# Patient Record
Sex: Male | Born: 1955
Health system: Southern US, Community
[De-identification: ages and names within clinical notes are randomized; demographics above are authoritative.]

## PROBLEM LIST (undated history)

## (undated) DIAGNOSIS — N200 Calculus of kidney: Secondary | ICD-10-CM

## (undated) DIAGNOSIS — M6530 Trigger finger, unspecified finger: Secondary | ICD-10-CM

## (undated) DIAGNOSIS — R002 Palpitations: Secondary | ICD-10-CM

## (undated) DIAGNOSIS — E785 Hyperlipidemia, unspecified: Secondary | ICD-10-CM

## (undated) DIAGNOSIS — N529 Male erectile dysfunction, unspecified: Secondary | ICD-10-CM

## (undated) DIAGNOSIS — R112 Nausea with vomiting, unspecified: Secondary | ICD-10-CM

## (undated) DIAGNOSIS — Z9889 Other specified postprocedural states: Secondary | ICD-10-CM

## (undated) DIAGNOSIS — R945 Abnormal results of liver function studies: Secondary | ICD-10-CM

## (undated) HISTORY — DX: Calculus of kidney: N20.0

## (undated) HISTORY — DX: Hyperlipidemia, unspecified: E78.5

## (undated) HISTORY — DX: Trigger finger, unspecified finger: M65.30

## (undated) HISTORY — DX: Abnormal results of liver function studies: R94.5

## (undated) HISTORY — DX: Male erectile dysfunction, unspecified: N52.9

## (undated) HISTORY — DX: Palpitations: R00.2

---

## 1989-08-22 HISTORY — PX: LAMINECTOMY: SHX219

## 1998-07-31 ENCOUNTER — Encounter: Payer: Self-pay | Admitting: Family Medicine

## 1998-07-31 LAB — CONVERTED CEMR LAB: PSA: 0.8 ng/mL

## 2004-06-24 ENCOUNTER — Ambulatory Visit: Payer: Self-pay | Admitting: Family Medicine

## 2004-06-29 ENCOUNTER — Ambulatory Visit (HOSPITAL_COMMUNITY): Admission: RE | Admit: 2004-06-29 | Discharge: 2004-06-29 | Payer: Self-pay | Admitting: Family Medicine

## 2004-07-20 ENCOUNTER — Ambulatory Visit (HOSPITAL_COMMUNITY): Admission: RE | Admit: 2004-07-20 | Discharge: 2004-07-21 | Payer: Self-pay | Admitting: Neurosurgery

## 2004-08-06 ENCOUNTER — Ambulatory Visit: Payer: Self-pay | Admitting: Family Medicine

## 2004-08-20 ENCOUNTER — Ambulatory Visit: Payer: Self-pay | Admitting: Family Medicine

## 2004-08-22 HISTORY — PX: LAMINECTOMY: SHX219

## 2004-08-24 ENCOUNTER — Ambulatory Visit: Payer: Self-pay | Admitting: Family Medicine

## 2006-09-01 ENCOUNTER — Ambulatory Visit: Payer: Self-pay | Admitting: Family Medicine

## 2006-12-21 HISTORY — PX: CT ABD W & PELVIS WO CM: HXRAD294

## 2006-12-31 ENCOUNTER — Emergency Department: Payer: Self-pay | Admitting: Unknown Physician Specialty

## 2007-01-01 ENCOUNTER — Telehealth: Payer: Self-pay | Admitting: Family Medicine

## 2007-01-05 ENCOUNTER — Telehealth (INDEPENDENT_AMBULATORY_CARE_PROVIDER_SITE_OTHER): Payer: Self-pay | Admitting: *Deleted

## 2007-01-13 DIAGNOSIS — R002 Palpitations: Secondary | ICD-10-CM | POA: Insufficient documentation

## 2007-02-20 ENCOUNTER — Emergency Department (HOSPITAL_COMMUNITY): Admission: EM | Admit: 2007-02-20 | Discharge: 2007-02-20 | Payer: Self-pay | Admitting: Emergency Medicine

## 2007-02-21 ENCOUNTER — Encounter: Payer: Self-pay | Admitting: Family Medicine

## 2007-02-21 DIAGNOSIS — N200 Calculus of kidney: Secondary | ICD-10-CM | POA: Insufficient documentation

## 2007-02-21 DIAGNOSIS — E785 Hyperlipidemia, unspecified: Secondary | ICD-10-CM | POA: Insufficient documentation

## 2007-03-02 ENCOUNTER — Ambulatory Visit: Payer: Self-pay | Admitting: Cardiology

## 2007-03-07 ENCOUNTER — Encounter: Payer: Self-pay | Admitting: Cardiology

## 2007-03-07 ENCOUNTER — Ambulatory Visit: Payer: Self-pay

## 2007-08-21 ENCOUNTER — Ambulatory Visit: Payer: Self-pay | Admitting: Family Medicine

## 2007-08-21 DIAGNOSIS — I493 Ventricular premature depolarization: Secondary | ICD-10-CM | POA: Insufficient documentation

## 2007-08-21 LAB — CONVERTED CEMR LAB
Cholesterol: 207 mg/dL (ref 0–200)
Direct LDL: 118.7 mg/dL
HDL: 36.6 mg/dL — ABNORMAL LOW (ref >39.0)
Magnesium: 2.3 mg/dL (ref 1.5–2.5)
TSH: 4.72 microintl units/mL (ref 0.35–5.50)
Total CHOL/HDL Ratio: 5.7
Triglycerides: 149 mg/dL (ref 0–149)
VLDL: 30 mg/dL (ref 0–40)

## 2007-08-22 ENCOUNTER — Ambulatory Visit: Payer: Self-pay | Admitting: Family Medicine

## 2007-10-01 ENCOUNTER — Ambulatory Visit: Payer: Self-pay | Admitting: Family Medicine

## 2007-10-01 LAB — CONVERTED CEMR LAB
ALT: 46 units/L (ref 0–53)
AST: 31 units/L (ref 0–37)

## 2007-10-19 ENCOUNTER — Ambulatory Visit: Payer: Self-pay | Admitting: Family Medicine

## 2007-10-20 LAB — CONVERTED CEMR LAB
ALT: 63 units/L — ABNORMAL HIGH (ref 0–53)
AST: 70 units/L — ABNORMAL HIGH (ref 0–37)
Albumin: 3.7 g/dL (ref 3.5–5.2)
Alkaline Phosphatase: 71 units/L (ref 39–117)
BUN: 12 mg/dL (ref 6–23)
Basophils Absolute: 0 10*3/uL (ref 0.0–0.1)
Basophils Relative: 0.3 % (ref 0.0–1.0)
Bilirubin, Direct: 0.1 mg/dL (ref 0.0–0.3)
CO2: 30 meq/L (ref 19–32)
Calcium: 9.3 mg/dL (ref 8.4–10.5)
Chloride: 106 meq/L (ref 96–112)
Cholesterol: 142 mg/dL (ref 0–200)
Creatinine, Ser: 1.1 mg/dL (ref 0.4–1.5)
Eosinophils Absolute: 0.1 10*3/uL (ref 0.0–0.6)
Eosinophils Relative: 3.3 % (ref 0.0–5.0)
GFR calc Af Amer: 90 mL/min
GFR calc non Af Amer: 75 mL/min
Glucose, Bld: 97 mg/dL (ref 70–99)
HCT: 44.6 % (ref 39.0–52.0)
HDL: 40.6 mg/dL (ref >39.0)
Hemoglobin: 15.2 g/dL (ref 13.0–17.0)
LDL Cholesterol: 88 mg/dL (ref 0–99)
Lymphocytes Relative: 30.2 % (ref 12.0–46.0)
MCHC: 34.2 g/dL (ref 30.0–36.0)
MCV: 98.6 fL (ref 78.0–100.0)
Monocytes Absolute: 0.6 10*3/uL (ref 0.2–0.7)
Monocytes Relative: 13.3 % — ABNORMAL HIGH (ref 3.0–11.0)
Neutro Abs: 2.4 10*3/uL (ref 1.4–7.7)
Neutrophils Relative %: 52.9 % (ref 43.0–77.0)
PSA: 0.43 ng/mL (ref 0.10–4.00)
Platelets: 136 10*3/uL — ABNORMAL LOW (ref 150–400)
Potassium: 4.8 meq/L (ref 3.5–5.1)
RBC: 4.52 M/uL (ref 4.22–5.81)
RDW: 12.5 % (ref 11.5–14.6)
Sodium: 140 meq/L (ref 135–145)
TSH: 4.38 microintl units/mL (ref 0.35–5.50)
Total Bilirubin: 0.9 mg/dL (ref 0.3–1.2)
Total CHOL/HDL Ratio: 3.5
Total Protein: 6.3 g/dL (ref 6.0–8.3)
Triglycerides: 68 mg/dL (ref 0–149)
VLDL: 14 mg/dL (ref 0–40)
WBC: 4.4 10*3/uL — ABNORMAL LOW (ref 4.5–10.5)

## 2007-10-24 ENCOUNTER — Ambulatory Visit: Payer: Self-pay | Admitting: Family Medicine

## 2007-10-24 DIAGNOSIS — R945 Abnormal results of liver function studies: Secondary | ICD-10-CM | POA: Insufficient documentation

## 2007-11-20 ENCOUNTER — Ambulatory Visit: Payer: Self-pay | Admitting: Family Medicine

## 2007-11-20 LAB — CONVERTED CEMR LAB
OCCULT 1: NEGATIVE
OCCULT 2: NEGATIVE
OCCULT 3: NEGATIVE

## 2007-11-20 LAB — FECAL OCCULT BLOOD, GUAIAC: Fecal Occult Blood: NEGATIVE

## 2007-11-21 ENCOUNTER — Encounter (INDEPENDENT_AMBULATORY_CARE_PROVIDER_SITE_OTHER): Payer: Self-pay | Admitting: *Deleted

## 2007-12-17 ENCOUNTER — Ambulatory Visit: Payer: Self-pay | Admitting: Family Medicine

## 2007-12-17 DIAGNOSIS — S61409A Unspecified open wound of unspecified hand, initial encounter: Secondary | ICD-10-CM | POA: Insufficient documentation

## 2008-01-01 ENCOUNTER — Ambulatory Visit: Payer: Self-pay | Admitting: Family Medicine

## 2008-01-03 LAB — CONVERTED CEMR LAB
ALT: 46 units/L (ref 0–53)
AST: 34 units/L (ref 0–37)

## 2008-04-08 ENCOUNTER — Ambulatory Visit: Payer: Self-pay | Admitting: Family Medicine

## 2008-04-08 DIAGNOSIS — L918 Other hypertrophic disorders of the skin: Secondary | ICD-10-CM | POA: Insufficient documentation

## 2009-07-21 ENCOUNTER — Ambulatory Visit: Payer: Self-pay | Admitting: Cardiovascular Disease

## 2009-08-04 ENCOUNTER — Ambulatory Visit: Payer: Self-pay | Admitting: Cardiovascular Disease

## 2009-08-10 LAB — CONVERTED CEMR LAB
BUN: 18 mg/dL (ref 6–23)
CO2: 23 meq/L (ref 19–32)
Calcium: 9.2 mg/dL (ref 8.4–10.5)
Chloride: 106 meq/L (ref 96–112)
Creatinine, Ser: 1 mg/dL (ref 0.40–1.50)
Glucose, Bld: 121 mg/dL — ABNORMAL HIGH (ref 70–99)
Potassium: 4 meq/L (ref 3.5–5.3)
Sodium: 140 meq/L (ref 135–145)

## 2010-03-15 ENCOUNTER — Ambulatory Visit: Payer: Self-pay | Admitting: Family Medicine

## 2010-03-15 DIAGNOSIS — N529 Male erectile dysfunction, unspecified: Secondary | ICD-10-CM | POA: Insufficient documentation

## 2010-03-18 ENCOUNTER — Encounter (INDEPENDENT_AMBULATORY_CARE_PROVIDER_SITE_OTHER): Payer: Self-pay | Admitting: *Deleted

## 2010-03-19 ENCOUNTER — Ambulatory Visit: Payer: Self-pay | Admitting: Family Medicine

## 2010-03-22 LAB — CONVERTED CEMR LAB
ALT: 29 units/L (ref 0–53)
AST: 26 units/L (ref 0–37)
Albumin: 3.6 g/dL (ref 3.5–5.2)
Alkaline Phosphatase: 58 units/L (ref 39–117)
BUN: 19 mg/dL (ref 6–23)
Bilirubin, Direct: 0.1 mg/dL (ref 0.0–0.3)
CO2: 27 meq/L (ref 19–32)
Calcium: 8.9 mg/dL (ref 8.4–10.5)
Chloride: 106 meq/L (ref 96–112)
Cholesterol: 138 mg/dL (ref 0–200)
Creatinine, Ser: 1.1 mg/dL (ref 0.4–1.5)
GFR calc non Af Amer: 73.84 mL/min (ref >60)
Glucose, Bld: 102 mg/dL — ABNORMAL HIGH (ref 70–99)
HDL: 36.4 mg/dL — ABNORMAL LOW (ref >39.00)
LDL Cholesterol: 81 mg/dL (ref 0–99)
Potassium: 4.3 meq/L (ref 3.5–5.1)
Sodium: 138 meq/L (ref 135–145)
Total Bilirubin: 0.8 mg/dL (ref 0.3–1.2)
Total CHOL/HDL Ratio: 4
Total Protein: 6 g/dL (ref 6.0–8.3)
Triglycerides: 102 mg/dL (ref 0.0–149.0)
VLDL: 20.4 mg/dL (ref 0.0–40.0)

## 2010-03-30 ENCOUNTER — Encounter (INDEPENDENT_AMBULATORY_CARE_PROVIDER_SITE_OTHER): Payer: Self-pay | Admitting: *Deleted

## 2010-07-14 ENCOUNTER — Telehealth (INDEPENDENT_AMBULATORY_CARE_PROVIDER_SITE_OTHER): Payer: Self-pay | Admitting: *Deleted

## 2010-07-22 ENCOUNTER — Ambulatory Visit: Payer: Self-pay | Admitting: Family Medicine

## 2010-07-23 LAB — CONVERTED CEMR LAB
ALT: 33 units/L (ref 0–53)
AST: 27 units/L (ref 0–37)
Albumin: 3.9 g/dL (ref 3.5–5.2)
Alkaline Phosphatase: 67 units/L (ref 39–117)
BUN: 18 mg/dL (ref 6–23)
Bilirubin, Direct: 0.1 mg/dL (ref 0.0–0.3)
CO2: 28 meq/L (ref 19–32)
Calcium: 9 mg/dL (ref 8.4–10.5)
Chloride: 101 meq/L (ref 96–112)
Cholesterol: 183 mg/dL (ref 0–200)
Creatinine, Ser: 1.2 mg/dL (ref 0.4–1.5)
GFR calc non Af Amer: 68.01 mL/min (ref >60)
Glucose, Bld: 101 mg/dL — ABNORMAL HIGH (ref 70–99)
HDL: 44.1 mg/dL (ref >39.00)
LDL Cholesterol: 119 mg/dL — ABNORMAL HIGH (ref 0–99)
PSA: 0.54 ng/mL (ref 0.10–4.00)
Potassium: 4.1 meq/L (ref 3.5–5.1)
Sodium: 137 meq/L (ref 135–145)
Total Bilirubin: 0.8 mg/dL (ref 0.3–1.2)
Total CHOL/HDL Ratio: 4
Total Protein: 6.5 g/dL (ref 6.0–8.3)
Triglycerides: 101 mg/dL (ref 0.0–149.0)
VLDL: 20.2 mg/dL (ref 0.0–40.0)

## 2010-07-26 ENCOUNTER — Ambulatory Visit: Payer: Self-pay | Admitting: Family Medicine

## 2010-07-26 ENCOUNTER — Encounter (INDEPENDENT_AMBULATORY_CARE_PROVIDER_SITE_OTHER): Payer: Self-pay | Admitting: *Deleted

## 2010-07-26 DIAGNOSIS — M6530 Trigger finger, unspecified finger: Secondary | ICD-10-CM | POA: Insufficient documentation

## 2010-07-26 LAB — CONVERTED CEMR LAB
Bilirubin Urine: NEGATIVE
Blood in Urine, dipstick: NEGATIVE
Glucose, Urine, Semiquant: NEGATIVE
Ketones, urine, test strip: NEGATIVE
Nitrite: NEGATIVE
Protein, U semiquant: NEGATIVE
Specific Gravity, Urine: 1.015
Urobilinogen, UA: 0.2
WBC Urine, dipstick: NEGATIVE
pH: 6

## 2010-07-29 ENCOUNTER — Encounter: Payer: Self-pay | Admitting: Family Medicine

## 2010-08-06 ENCOUNTER — Encounter: Payer: Self-pay | Admitting: Family Medicine

## 2010-09-06 ENCOUNTER — Ambulatory Visit: Admit: 2010-09-06 | Payer: Self-pay | Admitting: Gastroenterology

## 2010-09-19 LAB — CONVERTED CEMR LAB
BUN: 24 mg/dL — ABNORMAL HIGH (ref 6–23)
CO2: 24 meq/L (ref 19–32)
Calcium: 8.9 mg/dL (ref 8.4–10.5)
Chloride: 104 meq/L (ref 96–112)
Creatinine, Ser: 1.57 mg/dL — ABNORMAL HIGH (ref 0.40–1.50)
Glucose, Bld: 110 mg/dL — ABNORMAL HIGH (ref 70–99)
Potassium: 4.2 meq/L (ref 3.5–5.3)
Sodium: 140 meq/L (ref 135–145)
TSH: 3.835 microintl units/mL (ref 0.350–4.500)

## 2010-09-22 NOTE — Assessment & Plan Note (Signed)
Summary: REFILL MED/XFER FROM DR SCHALLER/CLE   Vital Signs:  Patient profile:   55 year old male Height:      69 inches Weight:      174.75 pounds BMI:     25.90 Temp:     97.8 degrees F oral Pulse rate:   80 / minute Pulse rhythm:   regular BP sitting:   142 / 90  (left arm) Cuff size:   regular  Vitals Entered By: Delilah Shan CMA Kerolos Nehme Dull) (March 15, 2010 2:53 PM) CC: Refill medication   History of Present Illness: Elevated Cholesterol: Using medications without problems:yes Muscle aches: not now, but initially had some knee aches- it is resolved Other complaints: no  ED- Occ noted lack of function.  No CP/SOB.  Good exertional capacity.  Asking about meds.  Never treated previously.   Allergies: No Known Drug Allergies  Past History:  Past Medical History: Last updated: 07/21/2009 Hyperlipidemia Palpitations secondary to PVCs Nephrolithiasis  Family History: Last updated: 03/15/2010 FatheR: ALIVE, healthy Mother: DECEASED age 46  DM, HTN No heart disease in siblings CV: NEGATIVE BP: + HTN MOTHER DIABETES: + MOTHER CANCER: NEGATIVE  Social History: Last updated: 03/15/2010 Marital Status: Married LIVES WITH WIFE  Children: ONE STEP SON Occupation: [employer] No tobacco No illicit drugs No alcohol Likes to travel with RV  Family History: Reviewed history from 07/21/2009 and no changes required. FatheR: ALIVE, healthy Mother: DECEASED age 25  DM, HTN No heart disease in siblings CV: NEGATIVE BP: + HTN MOTHER DIABETES: + MOTHER CANCER: NEGATIVE  Social History: Reviewed history from 07/21/2009 and no changes required. Marital Status: Married LIVES WITH WIFE  Children: ONE STEP SON Occupation: [employer] No tobacco No illicit drugs No alcohol Likes to travel with RV  Review of Systems       See HPI.  Otherwise negative.    Physical Exam  General:  GEN: nad, alert and oriented HEENT: mucous membranes moist NECK: supple w/o LA CV:  rrr.  no murmur PULM: ctab, no inc wob ABD: soft, +bs EXT: no edema SKIN: no acute rash    Impression & Recommendations:  Problem # 1:  ORGANIC IMPOTENCE (ICD-607.84) Routine instructions and follow up as needed.  he agrees.  His updated medication list for this problem includes:    Viagra 100 Mg Tabs (Sildenafil citrate) .Marland Kitchen... 0.5-1 tab by mouth qday as needed.  Problem # 2:  HYPERLIPIDEMIA (ICD-272.4) No change in meds.  Return for labs.  His updated medication list for this problem includes:    Pravachol 40 Mg Tabs (Pravastatin sodium) ..... One tab by mouth at night  Complete Medication List: 1)  Glucosamine 1500 Complex Caps (Glucosamine-chondroit-vit c-mn) .Marland Kitchen.. 1 daily 2)  Pravachol 40 Mg Tabs (Pravastatin sodium) .... One tab by mouth at night 3)  Fish Oil 1000 Mg Caps (Omega-3 fatty acids) .... Take 1 capsule by mouth three times a day 4)  Viagra 100 Mg Tabs (Sildenafil citrate) .... 0.5-1 tab by mouth qday as needed.  Patient Instructions: 1)  fasting cmet/lipid 272.0 2)  Schedule a lab visit on the way out today.  3)  We'll contact you with your lab report.   4)  Please schedule a follow-up appointment in 6 months for CPE.  Prescriptions: PRAVACHOL 40 MG  TABS (PRAVASTATIN SODIUM) one tab by mouth at night  #90 x 3   Entered and Authorized by:   Crawford Givens MD   Signed by:   Crawford Givens MD on  03/15/2010   Method used:   Electronically to        [facility] [address] (retail)       [address]       [place], [place]  [address]       Ph: [phone]       Fax: [phone]   RxID:   [id number] VIAGRA 100 MG TABS (SILDENAFIL CITRATE) 0.5-1 tab by mouth qday as needed.  #10 x 5   Entered and Authorized by:   Crawford Givens MD   Signed by:   Crawford Givens MD on 03/15/2010   Method used:   Electronically to        [facility]* (retail)       [address]       [place], [place]  [address]       Ph: [phone]       Fax: [phone]    RxID:   [id number]   Current Allergies (reviewed today): No known allergies

## 2010-09-22 NOTE — Letter (Signed)
Summary: Nadara Eaton letter  [place] at [facility]  [address] [place], [place] [address]   Phone: [phone]  Fax: [phone]       03/30/2010 MRN: [id number]  Zyshawn Pagnotta [address] [place], [place]  [address]  Dear Mr. [facility] [place], and [place] announce the retirement of Arta Silence, M.D., from full-time practice at the [facility] office effective February 18, 2010 and his plans of returning part-time.  It is important to Dr. Hetty Ely and to our practice that you understand that [facility] has seven physicians in our office for your health care needs.  We will continue to offer the same exceptional care that you have today.    Dr. Hetty Ely has spoken to many of you about his plans for retirement and returning part-time in the fall.   We will continue to work with you through the transition to schedule appointments for you in the office and meet the high standards that [place] is committed to.   Again, it is with great pleasure that we share the news that Dr. Hetty Ely will return to [facility] in October of 2011 with a reduced schedule.    If you have any questions, or would like to request an appointment with one of our physicians, please call us at [phone] and press the option for Scheduling an appointment.  We take pleasure in providing you with excellent patient care and look forward to seeing you at your next office visit.  Our [facility] Physicians are:  Tillman Abide, M.D. Laurita Quint, M.D. Roxy Manns, M.D. Kerby Nora, M.D. Hannah Beat, M.D. Ruthe Mannan, M.D. We proudly welcomed Raechel Ache, M.D. and Eustaquio Boyden, M.D. to the practice in July/August 2011.  Sincerely,  [facility]

## 2010-09-22 NOTE — Letter (Signed)
Summary: Pre Visit Letter Revised  [facility]  [address] [place], [place] [address]   Phone: [phone]  Fax: [phone]        07/26/2010 MRN: [id number] [name] [name] [address] [place], [place]  [address]             Procedure Date:  09/21/2010   Welcome to the Gastroenterology Division at [facility].    You are scheduled to see a nurse for your pre-procedure visit on 09/06/2010 at 11:00 on the 3rd floor at [facility], [address]  We ask that you try to arrive at our office 15 minutes prior to your appointment time to allow for check-in.  Please take a minute to review the attached form.  If you answer "Yes" to one or more of the questions on the first page, we ask that you call the person listed at your earliest opportunity.  If you answer "No" to all of the questions, please complete the rest of the form and bring it to your appointment.    Your nurse visit will consist of discussing your medical and surgical history, your immediate family medical history, and your medications.   If you are unable to list all of your medications on the form, please bring the medication bottles to your appointment and we will list them.  We will need to be aware of both prescribed and over the counter drugs.  We will need to know exact dosage information as well.    Please be prepared to read and sign documents such as consent forms, a financial agreement, and acknowledgement forms.  If necessary, and with your consent, a friend or relative is welcome to sit-in on the nurse visit with you.  Please bring your insurance card so that we may make a copy of it.  If your insurance requires a referral to see a specialist, please bring your referral form from your primary care physician.  No co-pay is required for this nurse visit.     If you cannot keep your appointment, please call [phone] to cancel or reschedule prior to your appointment date.  This allows  [place] the opportunity to schedule an appointment for another patient in need of care.    Thank you for choosing [facility] for your medical needs.  We appreciate the opportunity to care for you.  Please visit [place] at our website  to learn more about our practice.  Sincerely, The Gastroenterology Division

## 2010-09-22 NOTE — Letter (Signed)
Summary: [place] No Show Letter  [place] [facility]  [address] [place], [place] [address]   Phone: [phone]  Fax: [phone]    03/18/2010 MRN: [id number]  [name] [name] [address] [place], [place]  [address]   Dear Mr. Slattery,   Our records indicate that you missed your scheduled appointment with ___LAB__________________ on _7.27.11___________.  Please contact this office to reschedule your appointment as soon as possible.  It is important that you keep your scheduled appointments with your physician, so we can provide you the best care possible.  Please be advised that there may be a charge for "no show" appointments.    Sincerely,   [place] [facility]

## 2010-09-22 NOTE — Assessment & Plan Note (Signed)
Summary: CPX / LFW   Vital Signs:  Patient profile:   55 year old male Height:      69 inches Weight:      177.75 pounds BMI:     26.34 Temp:     98.2 degrees F oral Pulse rate:   80 / minute Pulse rhythm:   regular BP sitting:   116 / 74  (left arm) Cuff size:   large  Vitals Entered By: Delilah Shan CMA Duncan Dull) (July 26, 2010 11:14 AM) CC: CPX  Vision Screening:Left eye w/o correction: 20 / 20 Right Eye w/o correction: 20 / 20 Both eyes w/o correction:  20/ 15        Vision Entered By: Delilah Shan CMA Duncan Dull) (July 26, 2010 12:18 PM)   History of Present Illness: CPE- See plan.  labs reviewed with patient.   ED- Frequent intercourse as wife's libido is increased with HRT.  Asking about daily dosing of cialis.  tolerated viagra with no CP, adverse effect.   Elevated Cholesterol: Using medications without problems:yes Muscle aches: no Other complaints: no  R middle finger.  He can flex it but then he can't extend the PIP w/o manipulation.  No injury.  Gradually getting worse.    Needs DOT phyical.  He'll get the form and drop it off.  he doesn't have it today.   Allergies: No Known Drug Allergies  Past History:  Past Medical History: Last updated: 07/21/2009 Hyperlipidemia Palpitations secondary to PVCs Nephrolithiasis  Past Surgical History: Last updated: 10/24/2007 laminectomy: L 5: S-1/ (08/1989) Laminectomy L5 S1 (08/2004) CT ABD / PELVIS : STONE AT  [facility], DIVERTICS :(12/21/2006)  Family History: Last updated: 03/15/2010 FatheR: ALIVE, healthy Mother: DECEASED age 88  DM, HTN No heart disease in siblings CV: NEGATIVE BP: + HTN MOTHER DIABETES: + MOTHER CANCER: NEGATIVE  Social History: Last updated: 03/15/2010 Marital Status: Married LIVES WITH WIFE  Children: ONE STEP SON Occupation: [employer] No tobacco No illicit drugs No alcohol Likes to travel with RV  Review of Systems       See HPI.  Otherwise negative.     Physical Exam  General:  GEN: nad, alert and oriented HEENT: mucous membranes moist, PERRL, EOMI, tm wnl, nasal and oral exam wnl NECK: supple w/o LA CV: rrr.  no murmur PULM: ctab, no inc wob ABD: soft, +bs EXT: no edema SKIN: no acute rash  Normal strength/sens x4-gross, speech, gait wnl R 3rd PIP with ability to flex but unable to extend Rectal:  No external abnormalities noted. Normal sphincter tone. No rectal masses or tenderness. Prostate:  Prostate gland firm and smooth, no enlargement, nodularity, tenderness, mass, asymmetry or induration.   Impression & Recommendations:  Problem # 1:  HEALTH MAINTENANCE EXAM (ICD-V70.0) Healthy habits encouraged.  d/w patient about glucose today and avoiding high glycemic foods.  cont exercise.  flu shot done today.   Hearing (whispered across the room intact bilaterally) and vision intact on testing today.  I'll fill out DOT papers when he gets them.  Orders: UA Dipstick w/o Micro (manual) ([address])  Problem # 2:  TRIGGER FINGER, RIGHT MIDDLE (ICD-727.03) refer to hand center.  Orders: Orthopedic Surgeon Referral (Ortho Surgeon)  Problem # 3:  HYPERLIPIDEMIA (ICD-272.4) no change in meds.  diet/exercise discussed.  His updated medication list for this problem includes:    Pravachol 40 Mg Tabs (Pravastatin sodium) ..... One tab by mouth at night  Problem # 4:  ORGANIC IMPOTENCE (ICD-607.84)  change to cialis and report back as needed.  His updated medication list for this problem includes:    Cialis 5 Mg Tabs (Tadalafil) .Marland Kitchen... 0.5-1 tab by mouth once daily  Problem # 5:  SPECIAL SCREENING MALIG NEOPLASMS OTHER SITES (ICD-V76.49) refer to GI for colon Ca screen . Orders: Gastroenterology Referral (GI)  Complete Medication List: 1)  Glucosamine 1500 Complex Caps (Glucosamine-chondroit-vit c-mn) .Marland Kitchen.. 1 daily 2)  Pravachol 40 Mg Tabs (Pravastatin sodium) .... One tab by mouth at night 3)  Fish Oil 1000 Mg Caps (Omega-3 fatty acids)  .... Take 1 capsule by mouth three times a day 4)  Cialis 5 Mg Tabs (Tadalafil) .... 0.5-1 tab by mouth once daily  Other Orders: Admin 1st Vaccine ([address]) Flu Vaccine 2yrs + [phone])  Patient Instructions: 1)  Try the cialis and let me know if it isn't working well 2)  See Shirlee Limerick about your referral before your leave today (ortho and GI). 3)  Let me know what you need for the driving form (CDL or otherwise). 4)  Don't change your meds otherwise.  Take care.  Glad to see you today.  Prescriptions: CIALIS 5 MG TABS (TADALAFIL) 0.5-1 tab by mouth once daily  #30 x 5   Entered and Authorized by:   Crawford Givens MD   Signed by:   Crawford Givens MD on 07/26/2010   Method used:   Electronically to        [facility]* (retail)       [address]       [place], [place]  [address]       Ph: [phone]       Fax: [phone]   RxID:   [id number]    Orders Added: 1)  Est. Patient 40-64 years [[address]] 2)  Est. Patient Level III [[address]] 3)  UA Dipstick w/o Micro (manual) [81002] 4)  Admin 1st Vaccine [90471] 5)  Flu Vaccine 63yrs + [[address]] 6)  Gastroenterology Referral [GI] 7)  Orthopedic Surgeon Referral [Ortho Surgeon]    Current Allergies (reviewed today): No known allergies Flu Vaccine Consent Questions     Do you have a history of severe allergic reactions to this vaccine? no    Any prior history of allergic reactions to egg and/or gelatin? no    Do you have a sensitivity to the preservative Thimersol? no    Do you have a past history of Guillan-Barre Syndrome? no    Do you currently have an acute febrile illness? no    Have you ever had a severe reaction to latex? no    Vaccine information given and explained to patient? yes    Are you currently pregnant? no    Lot Number:[id number]   Exp Date:02/19/2011   Site Given  Left Deltoid IM  Lugene Fuquay CMA (AAMA)  July 26, 2010 12:18 PM    .lbflu Laboratory Results   Urine Tests  Date/Time Received: July 26, 2010 12:25 PM   Routine Urinalysis   Color: yellow Appearance: Clear Glucose: negative   (Normal Range: Negative) Bilirubin: negative   (Normal Range: Negative) Ketone: negative   (Normal Range: Negative) Spec. Gravity: 1.015   (Normal Range: 1.003-1.035) Blood: negative   (Normal Range: Negative) pH: 6.0   (Normal Range: 5.0-8.0) Protein: negative   (Normal Range: Negative) Urobilinogen: 0.2   (Normal Range: 0-1) Nitrite: negative   (Normal Range: Negative) Leukocyte Esterace: negative   (Normal Range: Negative)

## 2010-09-22 NOTE — Progress Notes (Signed)
----   Converted from flag ---- ---- 07/13/2010 2:05 PM, Crawford Givens MD wrote: cmet/lipid 272.0 psa v76.44  ---- 07/13/2010 1:35 PM, Liane Comber CMA (AAMA) wrote: Lab orders please! Good Morning! This pt is scheduled for cpx labs Murphy, which labs to draw and dx codes to use? Thanks Tasha ------------------------------

## 2010-09-23 NOTE — Consult Note (Signed)
Summary: [facility]   Imported By: Maryln Gottron 08/06/2010 14:54:05  _____________________________________________________________________  External Attachment:    Type:   Image     Comment:   External Document

## 2010-09-23 NOTE — Letter (Signed)
Summary: DOT Physical  DOT Physical   Imported By: Beau Fanny 08/06/2010 13:04:30  _____________________________________________________________________  External Attachment:    Type:   Image     Comment:   External Document

## 2011-01-04 NOTE — Assessment & Plan Note (Signed)
[facility]                            CARDIOLOGY OFFICE NOTE   NAME:[name] [name] NEGRON                    MRN:          [id number]  DATE:03/02/2007                            DOB:          08/16/1956    PRIMARY CARE PHYSICIAN:  [name] [name], M.D.   REASON FOR PRESENTATION:  Evaluate a patient with palpitations.   HISTORY OF PRESENT ILLNESS:  The patient is a pleasant 55 year old  gentleman without prior cardiac history.  He has noticed for 2 months  episodes of palpitations and dizziness.  He has noticed the dizziness  with bending over.  He has had no presyncope or syncope.  He was  noticing the palpitations sporadically but they became more significant  about a week ago.  One Sunday he was lying back resting and he noticed  frequent skipped beats.  It went on for the better part of that day.  That next morning he had increasing symptoms and called 911.  He went to  [facility].  There he had negative cardiac markers.  His EKG did  demonstrate premature ventricular contractions but no other acute  abnormalities.  He was discharged for followup here.  Since then, he has  stopped caffeine completely for the last 3 days.  He says he is no  longer getting those palpitations, though he feels like he is on the  verge.  He has not had any sustained tachy arrhythmia symptoms.  Again,  he has not had any presyncope or syncope.  He is quite active.  He works  very hard and gets his heart rate up quite a bit doing very vigorous  activity.  He says he never had any chest pressure, neck or arm  discomfort.  He has no shortness of breath and denies any PND or  orthopnea.   PAST MEDICAL HISTORY:  1. He does have a history of nephrolithiasis and did pass a kidney      stone spontaneously.  2. He has no history of hypertension, hyperlipidemia, or diabetes.   PAST SURGICAL HISTORY:  Back surgery x2.   ALLERGIES:  None.   MEDICATIONS:  None.   SOCIAL  HISTORY:  The patient is married.  He has been under a lot of  stress as his son was in ATV accident a year ago and though now well was  quite sick for a while.  He also has a small business.  He is married.  He does have the one son.  He has never smoked cigarettes.  He does not  drink alcohol.   FAMILY HISTORY:  Noncontributory for early coronary artery disease,  palpitations, sudden death, cardiomyopathy.   REVIEW OF SYSTEMS:  As stated in the HPI and otherwise negative for all  other systems.   PHYSICAL EXAMINATION:  GENERAL:  The patient is in no distress.  VITAL SIGNS:  Blood pressure 118/78, heart rate 68 and regular, weight  165 pounds.  HEENT:  Eyelids unremarkable.  Pupils are equal, round, and reactive to  light.  Fundi within normal limits.  Oral mucosa unremarkable.  NECK:  No jugular  venous distention.  Wave form within normal limits.  Carotid upstroke brisk and symmetric.  No bruits.  No thyromegaly.  LYMPHATICS:  No cervical, axillary, or inguinal adenopathy.  LUNGS:  Clear to auscultation bilaterally.  BACK:  No costovertebral angle tenderness.  CHEST:  Unremarkable.  HEART:  PMI not displaced or sustained.  S1 and S2 within normal limits.  No S3, no S4, no clicks, no rubs, no murmurs.  ABDOMEN:  Flat.  Positive bowel sounds, normal in frequency and pitch.  No bruits.  No rebound.  No guarding.  No midline pulsatile mass.  No  hepatomegaly, no splenomegaly.  SKIN:  No rashes.  No nodules.  EXTREMITIES:  Pulses 2+ throughout.  No edema.  No cyanosis.  No  clubbing.  NEUROLOGIC:  Oriented to person, place and time.  Cranial nerves II-XII  grossly intact.  Motor grossly intact.   EKG:  Sinus rhythm, rate 63, axis within normal limits, intervals within  normal limits, premature ventricular contractions, no acute ST-T wave  changes.   ASSESSMENT/PLAN:  1. Palpitations.  The patient is no longer having the premature      ventricular contractions which is what he was  experiencing.  This      stopped with caffeine.  I am going to check a TSH and a magnesium.      I am also going to follow up an echocardiogram.  Provided he does      not have any further palpitations and these studies are normal, no      further workup is planned.  However, if he does have any increasing      palpitations, I will most likely treat him symptomatically with a      beta-blocker or calcium channel blocker.  He and I had a long      discussion about this.  His wife was in attendance.  2. Risk reduction.  He wants to get a lipid profile and I will arrange      to have this done.  His blood work will be at Dr. Lorenza Chick      office.   FOLLOWUP:  Back in this clinic as needed.     Rollene Rotunda, MD, [facility]  Electronically Signed    JH/MedQ  DD: 03/02/2007  DT: 03/02/2007  Job #: [id number]   cc:   [name] Silence, MD

## 2011-01-07 NOTE — Op Note (Signed)
NAME:  [name] [name], VOWELS NO.:  [id number]   MEDICAL RECORD NO.:  [id number]          PATIENT TYPE:  OIB   LOCATION:  2899                         FACILITY:  [facility]   PHYSICIAN:  Reinaldo Meeker, M.D. DATE OF BIRTH:  Jul 27, 1956   DATE OF PROCEDURE:  07/20/2004  DATE OF DISCHARGE:                                 OPERATIVE REPORT   PREOPERATIVE DIAGNOSIS:  Herniated disk L5-S1, right.   POSTOPERATIVE DIAGNOSIS:  Herniated disk L5-S1, right.   OPERATION PERFORMED:  Right L5-S1 redo microdiskectomy.   SECONDARY PROCEDURE:  Microdissection L5-S1 disk and S1 nerve root.   SURGEON:  Reinaldo Meeker, M.D.   ASSISTANT:  Kathaleen Maser. Pool, M.D.   ANESTHESIA:  General.   DESCRIPTION OF PROCEDURE:  After being placed in the prone position, the  patient's back was prepped and draped in the usual sterile fashion.  Localizing x-ray was taken prior to incision to identify the appropriate  level.  Midline incision was made above the spinous processes of L5 and S1,  opening the previous lumbar incision.  Using the Bovie cutting current, the  incision was carried down to the spinous processes.  Subperiosteal  dissection was then carried out along the right side of the spinous  processes and lamina and the self-retaining retractor was placed for  exposure.  Second x-ray showed approach to the appropriate level.  Edges of  the previous laminotomy were identified and the laminotomy enlarged by  moving the inferior one half of the L5 lamina and the medial one third of  the facet joint an the superior one half of the S1 lamina.  Residual bone  and scar tissue and some residual ligamentum flavum were removed.  The  operating microscope was draped and brought into the field and used for the  remainder of the case. Using microdissection technique, starting at the  pedicle of S1, S1 nerve root was identified.  Further dissection was carried  up superior until the L5-S1 disk was identified  and found to be herniated  with additional scar tissue and disk material on top of it.  This was  dissected free and cleaned out and the disk space was then entered with a 15  blade.  Using Pituitary rongeurs and curettes, a thorough disk space  cleanout was carried out.  At the same time, great care was taken to avoid  injury to the neural elements and this was successfully done.  At this time  inspection was carried out in all directions for any evidence of residual  compression and none could be identified.  Large amounts of irrigation were  carried out and any bleeding controlled with bipolar coagulation and  Gelfoam.  The wound was then closed using interrupted Vicryl in the muscle,  fascia, subcutaneous and subcuticular tissues and staples on the skin.  A  sterile dressing was then applied and the patient was extubated and taken to  the recovery room in stable condition.       ROK/MEDQ  D:  07/20/2004  T:  07/20/2004  Job:  [id number]

## 2011-02-04 ENCOUNTER — Encounter: Payer: Self-pay | Admitting: Cardiovascular Disease

## 2011-04-04 ENCOUNTER — Other Ambulatory Visit: Payer: Self-pay | Admitting: Family Medicine

## 2011-06-07 LAB — I-STAT 8, (EC8 V) (CONVERTED LAB)
BUN: 16
Bicarbonate: 27.6 — ABNORMAL HIGH
Chloride: 106
Glucose, Bld: 97
HCT: 41
Hemoglobin: 13.9
Operator id: 146091
Potassium: 4.3
Sodium: 140
TCO2: 29
pCO2, Ven: 56.8 — ABNORMAL HIGH
pH, Ven: 7.294

## 2011-06-07 LAB — DIFFERENTIAL
Basophils Absolute: 0
Basophils Relative: 0
Eosinophils Absolute: 0.1
Eosinophils Relative: 2
Lymphocytes Relative: 22
Lymphs Abs: 0.9
Monocytes Absolute: 0.6
Monocytes Relative: 13 — ABNORMAL HIGH
Neutro Abs: 2.6
Neutrophils Relative %: 63

## 2011-06-07 LAB — POCT CARDIAC MARKERS
CKMB, poc: 1.3
CKMB, poc: 1.8
CKMB, poc: 2.3
Myoglobin, poc: 102
Myoglobin, poc: 83
Myoglobin, poc: 96.1
Operator id: 146091
Operator id: 146091
Operator id: 146091
Troponin i, poc: <0.05
Troponin i, poc: <0.05
Troponin i, poc: <0.05

## 2011-06-07 LAB — CBC
HCT: 39.1
Hemoglobin: 13.4
MCHC: 34.4
MCV: 95.5
Platelets: 166
RBC: 4.09 — ABNORMAL LOW
RDW: 13.5
WBC: 4.2

## 2011-06-07 LAB — POCT I-STAT CREATININE
Creatinine, Ser: 1.1
Operator id: 146091

## 2011-09-26 ENCOUNTER — Ambulatory Visit (INDEPENDENT_AMBULATORY_CARE_PROVIDER_SITE_OTHER): Payer: BC Managed Care – PPO | Admitting: Family Medicine

## 2011-09-26 ENCOUNTER — Encounter: Payer: Self-pay | Admitting: Family Medicine

## 2011-09-26 ENCOUNTER — Ambulatory Visit (INDEPENDENT_AMBULATORY_CARE_PROVIDER_SITE_OTHER)
Admission: RE | Admit: 2011-09-26 | Discharge: 2011-09-26 | Disposition: A | Discharge status: Home / Self Care | Payer: BC Managed Care – PPO | Source: Ambulatory Visit | Attending: Family Medicine | Admitting: Family Medicine

## 2011-09-26 VITALS — BP 124/72 | HR 100 | Temp 99.6°F | Wt 169.2 lb

## 2011-09-26 DIAGNOSIS — R059 Cough, unspecified: Secondary | ICD-10-CM

## 2011-09-26 DIAGNOSIS — R05 Cough: Secondary | ICD-10-CM

## 2011-09-26 DIAGNOSIS — J189 Pneumonia, unspecified organism: Secondary | ICD-10-CM | POA: Insufficient documentation

## 2011-09-26 MED ORDER — AZITHROMYCIN 250 MG PO TABS: ORAL_TABLET | ORAL | Status: AC

## 2011-09-26 MED ORDER — HYDROCOD POLST-CHLORPHEN POLST 10-8 MG/5ML PO LQCR: 5.0000 mL | Freq: Every evening | ORAL | Status: DC | PRN

## 2011-09-26 NOTE — Progress Notes (Signed)
  Subjective:    Patient ID: [name] [name], male    DOB: 06/03/1956, 56 y.o.   MRN: [id number]  HPI CC: bad cough  12+ d h/o bad cough.  Started with pain left side shoulder/chest, then next day developed cough.  Went to [facility] 9d ago, dx with R ear infection, prescribed omnicef x 10 days and tussionex which does help cough.  Cough constant, nagging.  Minimally productive of phlegm.  No improvement.  Had temperature up to 102, last a few nights ago.  Now low grade.  Noticing rash on chest as well.  Coughing leading to dry heaves.  Some sweating.  appetite down. + myalgia and arthralgias.  Minimal congestion, + HA from coughing.  No abd pain, n/v/d, ear pain or tooth pain.  No RN.  No chest pain or tightness, SOB.  No sick contacts at home.  No smokers at home.  No h/o asthma.  No lung hx, no h/o smoking.  Review of Systems per HPi    Objective:   Physical Exam  Nursing note and vitals reviewed. Constitutional: He appears well-developed and well-nourished. No distress.  HENT:  Head: Normocephalic and atraumatic.  Right Ear: Hearing, tympanic membrane, external ear and ear canal normal.  Left Ear: Hearing, tympanic membrane, external ear and ear canal normal.  Nose: Nose normal. No mucosal edema or rhinorrhea. Right sinus exhibits no maxillary sinus tenderness and no frontal sinus tenderness. Left sinus exhibits no maxillary sinus tenderness and no frontal sinus tenderness.  Mouth/Throat: Uvula is midline and mucous membranes are normal. Posterior oropharyngeal erythema present. No oropharyngeal exudate, posterior oropharyngeal edema or tonsillar abscesses.  Eyes: Conjunctivae and EOM are normal. Pupils are equal, round, and reactive to light. No scleral icterus.  Neck: Normal range of motion. Neck supple.  Cardiovascular: Normal rate, regular rhythm, normal heart sounds and intact distal pulses.   No murmur heard. Pulmonary/Chest: Effort normal. No accessory muscle usage. Not  tachypneic. No respiratory distress. He has decreased breath sounds (LLL). He has no wheezes. He has no rhonchi. He has rales (throughout left). He exhibits no tenderness.       Constant cough  Lymphadenopathy:    He has no cervical adenopathy.       Right: No supraclavicular adenopathy present.       Left: No supraclavicular adenopathy present.  Skin: Skin is warm and dry. No rash noted.       Assessment & Plan:

## 2011-09-26 NOTE — Patient Instructions (Addendum)
You have pneumonia. Take zpack for 5 days to help cover atypical pneumonia organisms.  Refilled cough syrup. You should be feeling better after antibiotics, cough may linger for several weeks. Please update [place] if not improving as expected.    Pneumonia, Adult Pneumonia is an infection of the lungs.  CAUSES Pneumonia may be caused by bacteria or a virus. Usually, these infections are caused by breathing infectious particles into the lungs (respiratory tract). SYMPTOMS   Cough.   Fever.   Chest pain.   Increased rate of breathing.   Wheezing.   Mucus production.  DIAGNOSIS  If you have the common symptoms of pneumonia, your caregiver will typically confirm the diagnosis with a chest X-ray. The X-ray will show an abnormality in the lung (pulmonary infiltrate) if you have pneumonia. Other tests of your blood, urine, or sputum may be done to find the specific cause of your pneumonia. Your caregiver may also do tests (blood gases or pulse oximetry) to see how well your lungs are working. TREATMENT  Some forms of pneumonia may be spread to other people when you cough or sneeze. You may be asked to wear a mask before and during your exam. Pneumonia that is caused by bacteria is treated with antibiotic medicine. Pneumonia that is caused by the influenza virus may be treated with an antiviral medicine. Most other viral infections must run their course. These infections will not respond to antibiotics.  PREVENTION A pneumococcal shot (vaccine) is available to prevent a common bacterial cause of pneumonia. This is usually suggested for:  People over 47 years old.   Patients on chemotherapy.   People with chronic lung problems, such as bronchitis or emphysema.   People with immune system problems.  If you are over 65 or have a high risk condition, you may receive the pneumococcal vaccine if you have not received it before. In some countries, a routine influenza vaccine is also recommended.  This vaccine can help prevent some cases of pneumonia.You may be offered the influenza vaccine as part of your care. If you smoke, it is time to quit. You may receive instructions on how to stop smoking. Your caregiver can provide medicines and counseling to help you quit. HOME CARE INSTRUCTIONS   Cough suppressants may be used if you are losing too much rest. However, coughing protects you by clearing your lungs. You should avoid using cough suppressants if you can.   Your caregiver may have prescribed medicine if he or she thinks your pneumonia is caused by a bacteria or influenza. Finish your medicine even if you start to feel better.   Your caregiver may also prescribe an expectorant. This loosens the mucus to be coughed up.   Only take over-the-counter or prescription medicines for pain, discomfort, or fever as directed by your caregiver.   Do not smoke. Smoking is a common cause of bronchitis and can contribute to pneumonia. If you are a smoker and continue to smoke, your cough may last several weeks after your pneumonia has cleared.   A cold steam vaporizer or humidifier in your room or home may help loosen mucus.   Coughing is often worse at night. Sleeping in a semi-upright position in a recliner or using a couple pillows under your head will help with this.   Get rest as you feel it is needed. Your body will usually let you know when you need to rest.  SEEK IMMEDIATE MEDICAL CARE IF:   Your illness becomes worse. This is  especially true if you are elderly or weakened from any other disease.   You cannot control your cough with suppressants and are losing sleep.   You begin coughing up blood.   You develop pain which is getting worse or is uncontrolled with medicines.   You have a fever.   Any of the symptoms which initially brought you in for treatment are getting worse rather than better.   You develop shortness of breath or chest pain.  MAKE SURE YOU:   Understand  these instructions.   Will watch your condition.   Will get help right away if you are not doing well or get worse.  Document Released: 08/08/2005 Document Revised: 04/20/2011 Document Reviewed: 10/28/2010 [facility] Patient Information 2012 [place], [place].

## 2011-09-26 NOTE — Assessment & Plan Note (Addendum)
S/p 10 d treatment with omnicef for AOM (ear looks normal today). Mild hypoxemia, minimal sputum. Will treat with azithromycin as already completed 10d course of 3rd gen ceph. Clinical dx of atypical PNA. cxr today - LLL PNA

## 2011-10-19 ENCOUNTER — Other Ambulatory Visit: Payer: Self-pay | Admitting: Family Medicine

## 2011-10-19 NOTE — Telephone Encounter (Signed)
Yes.  Sent.

## 2011-10-19 NOTE — Telephone Encounter (Signed)
Received refill request electronically from pharmacy. Patient has not had lab work in over a year. Patient has scheduled an appointment for 11/04/11 to see you have lab work done that day. Is it okay to refill medication until that appointment?

## 2011-11-04 ENCOUNTER — Encounter: Payer: Self-pay | Admitting: Family Medicine

## 2011-11-04 ENCOUNTER — Ambulatory Visit (INDEPENDENT_AMBULATORY_CARE_PROVIDER_SITE_OTHER): Payer: BC Managed Care – PPO | Admitting: Family Medicine

## 2011-11-04 VITALS — BP 142/80 | HR 72 | Temp 98.2°F | Wt 171.0 lb

## 2011-11-04 DIAGNOSIS — E785 Hyperlipidemia, unspecified: Secondary | ICD-10-CM

## 2011-11-04 DIAGNOSIS — F411 Generalized anxiety disorder: Secondary | ICD-10-CM

## 2011-11-04 DIAGNOSIS — Z23 Encounter for immunization: Secondary | ICD-10-CM

## 2011-11-04 DIAGNOSIS — N529 Male erectile dysfunction, unspecified: Secondary | ICD-10-CM

## 2011-11-04 DIAGNOSIS — Z0000 Encounter for general adult medical examination without abnormal findings: Secondary | ICD-10-CM

## 2011-11-04 DIAGNOSIS — E7800 Pure hypercholesterolemia, unspecified: Secondary | ICD-10-CM

## 2011-11-04 DIAGNOSIS — F418 Other specified anxiety disorders: Secondary | ICD-10-CM

## 2011-11-04 NOTE — Patient Instructions (Signed)
Get back on your bike for exercise and let know if you have concerns.   Take care.   Labs on Monday, fasting.  We'll contact you with your lab report.

## 2011-11-04 NOTE — Progress Notes (Signed)
CPE- See plan.  Routine anticipatory guidance given to patient.  See health maintenance.  Elevated Cholesterol: Using medications without problems:yes Muscle aches: no Diet compliance:yes Exercise:yes  Anxiety. He has a lot going on with business.  "Something's not right."  No Si/Hi.  Not tob/etoh.  Sleeping is at baseline- light sleeper.  He thinks that work stress is the main issue.  He isn't exercising as much.  We talked about options.    Asking about low T.  H/o ED; cialis helped.  Still with fatigue and low muscle mass.    Will return for fasting labs.   PMH and SH reviewed  Meds, vitals, and allergies reviewed.   ROS: See HPI.  Otherwise negative.    GEN: nad, alert and oriented HEENT: mucous membranes moist NECK: supple w/o LA CV: rrr. PULM: ctab, no inc wob ABD: soft, +bs EXT: no edema SKIN: no acute rash Prostate gland firm and smooth, no enlargement, nodularity, tenderness, mass, asymmetry or induration.

## 2011-11-06 DIAGNOSIS — F418 Other specified anxiety disorders: Secondary | ICD-10-CM | POA: Insufficient documentation

## 2011-11-06 DIAGNOSIS — Z0000 Encounter for general adult medical examination without abnormal findings: Secondary | ICD-10-CM | POA: Insufficient documentation

## 2011-11-06 NOTE — Assessment & Plan Note (Signed)
D/w patient [id number] for colon cancer screening, including IFOB vs. colonoscopy.  Risks and benefits of both were discussed and patient voiced understanding.  Pt elects NFA:OZHY.  PSA options were discussed along with recent recs.  No indication for psa at this point, since patient is low risk and there is no FH of prosate CA.  He declined testing of PSA. Continue diet and inc exercise.   PNA vaccine today, return for labs.  Healthy habits encouraged.

## 2011-11-06 NOTE — Assessment & Plan Note (Signed)
Likely work related, okay for outpatient f/u.  Inc exercise and f/u prn.

## 2011-11-06 NOTE — Assessment & Plan Note (Signed)
Return for testosterone level.

## 2011-11-06 NOTE — Assessment & Plan Note (Signed)
Return for labs. Continue meds and diet.

## 2011-11-07 ENCOUNTER — Other Ambulatory Visit (INDEPENDENT_AMBULATORY_CARE_PROVIDER_SITE_OTHER): Payer: BC Managed Care – PPO

## 2011-11-07 DIAGNOSIS — N529 Male erectile dysfunction, unspecified: Secondary | ICD-10-CM

## 2011-11-07 DIAGNOSIS — E7800 Pure hypercholesterolemia, unspecified: Secondary | ICD-10-CM

## 2011-11-07 LAB — LIPID PANEL
Cholesterol: 157 mg/dL (ref 0–200)
HDL: 44.8 mg/dL (ref >39.00)
Total CHOL/HDL Ratio: 4
Triglycerides: 101 mg/dL (ref 0.0–149.0)

## 2011-11-07 LAB — COMPREHENSIVE METABOLIC PANEL
AST: 21 U/L (ref 0–37)
Alkaline Phosphatase: 65 U/L (ref 39–117)
BUN: 17 mg/dL (ref 6–23)
Calcium: 9.3 mg/dL (ref 8.4–10.5)
Creatinine, Ser: 1.1 mg/dL (ref 0.4–1.5)
Glucose, Bld: 104 mg/dL — ABNORMAL HIGH (ref 70–99)

## 2011-11-08 ENCOUNTER — Encounter: Payer: Self-pay | Admitting: *Deleted

## 2012-06-29 ENCOUNTER — Ambulatory Visit (INDEPENDENT_AMBULATORY_CARE_PROVIDER_SITE_OTHER): Payer: BC Managed Care – PPO | Admitting: Family Medicine

## 2012-06-29 ENCOUNTER — Encounter: Payer: Self-pay | Admitting: Family Medicine

## 2012-06-29 VITALS — BP 136/76 | HR 81 | Temp 98.1°F | Wt 172.8 lb

## 2012-06-29 DIAGNOSIS — F418 Other specified anxiety disorders: Secondary | ICD-10-CM

## 2012-06-29 DIAGNOSIS — F411 Generalized anxiety disorder: Secondary | ICD-10-CM

## 2012-06-29 MED ORDER — DIAZEPAM 5 MG PO TABS: 2.5000 mg | ORAL_TABLET | Freq: Three times a day (TID) | ORAL | Status: DC | PRN

## 2012-06-29 MED ORDER — FLUOXETINE HCL 20 MG PO TABS: 20.0000 mg | ORAL_TABLET | Freq: Every day | ORAL | Status: DC

## 2012-06-29 NOTE — Patient Instructions (Addendum)
Take the valium as needed. Start with half a pill.  It can make you drowsy.   Start the prozac.   Call back with an update in about 2 weeks.  Leave me a message.  Take care.  Glad to see you.

## 2012-06-29 NOTE — Progress Notes (Signed)
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  "He ran it in the ground and embezelled from me."  Per patient had been going on for 2 years.  He found out about in the last 2 months.  He talked to the police. Per pt he'll likely have to liquidate, will likely lose the business, may lose his home.  Still married, wife supportive but this has been tough for both of them.    Has been getting calls from creditors, IRS.  He gets jittery, nervous, startled by his cell phone.  He wants to be alone.  Only feels better when alone in the truck.  Fatigued, sleep disrupted.   Nonsmoker, not drinking.    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   Had some left over valium.  It helped some with sleep and anxiety.    No SI/HI.  Contracts for safety.   Meds, vitals, and allergies reviewed.   ROS: See HPI.  Otherwise, noncontributory.  nad Worried appearing rrr Speech wnl, judgement wnl No tremor

## 2012-06-29 NOTE — Assessment & Plan Note (Signed)
Exacerbated by tremendous financial strain.  No SI/HI.  Okay for outpatient f/u.  Will start SSRI with typical precautions.  Use prn BZD w/sedation caution.  If worsening, he'll notify clinic.  Will notify clinic with update anyway in about 2 weeks.  Contracts for safety.  He is in contact with lawyers, tax office, authorities.  Support offered to patient. He agrees with plan.

## 2012-11-07 ENCOUNTER — Other Ambulatory Visit: Payer: Self-pay | Admitting: Family Medicine

## 2012-11-29 ENCOUNTER — Other Ambulatory Visit: Payer: Self-pay | Admitting: *Deleted

## 2012-11-29 NOTE — Telephone Encounter (Signed)
Last filled 07/30/12

## 2012-11-30 ENCOUNTER — Other Ambulatory Visit: Payer: Self-pay | Admitting: *Deleted

## 2012-11-30 MED ORDER — DIAZEPAM 5 MG PO TABS: 2.5000 mg | ORAL_TABLET | Freq: Three times a day (TID) | ORAL | Status: DC | PRN

## 2012-11-30 NOTE — Telephone Encounter (Signed)
Rx refill called in as directed.

## 2012-11-30 NOTE — Telephone Encounter (Signed)
plz phone in. 

## 2012-11-30 NOTE — Telephone Encounter (Signed)
Pt going out of town needs rx asap, last filled 07/30/12

## 2013-01-16 ENCOUNTER — Other Ambulatory Visit: Payer: Self-pay | Admitting: Family Medicine

## 2013-01-16 NOTE — Telephone Encounter (Signed)
Received fax refill, Dr. Para March not in office this week, please advise

## 2013-01-17 MED ORDER — DIAZEPAM 5 MG PO TABS: 2.5000 mg | ORAL_TABLET | Freq: Three times a day (TID) | ORAL | Status: DC | PRN

## 2013-01-17 NOTE — Telephone Encounter (Signed)
plz phone in. 

## 2013-01-17 NOTE — Telephone Encounter (Signed)
Rx called in as directed.   

## 2013-02-12 ENCOUNTER — Other Ambulatory Visit: Payer: Self-pay | Admitting: Family Medicine

## 2013-02-12 ENCOUNTER — Other Ambulatory Visit: Payer: Self-pay | Admitting: *Deleted

## 2013-02-12 NOTE — Telephone Encounter (Signed)
Electronic refill request.  Patient is overdue for CPE.  Please advise.

## 2013-02-13 MED ORDER — FLUOXETINE HCL 20 MG PO TABS: 20.0000 mg | ORAL_TABLET | Freq: Every day | ORAL | Status: DC

## 2013-02-13 NOTE — Telephone Encounter (Signed)
Schedule a CPE, sent. Thanks.

## 2013-02-14 NOTE — Telephone Encounter (Signed)
Left detailed message on voicemail.  

## 2013-02-21 ENCOUNTER — Other Ambulatory Visit: Payer: Self-pay | Admitting: *Deleted

## 2013-02-21 NOTE — Telephone Encounter (Signed)
Last filled 01/17/13 

## 2013-02-24 MED ORDER — DIAZEPAM 5 MG PO TABS: 2.5000 mg | ORAL_TABLET | Freq: Three times a day (TID) | ORAL | Status: DC | PRN

## 2013-02-24 NOTE — Telephone Encounter (Signed)
Please call in

## 2013-02-25 NOTE — Telephone Encounter (Signed)
Medication phoned to pharmacy.  

## 2013-03-14 ENCOUNTER — Other Ambulatory Visit: Payer: Self-pay | Admitting: *Deleted

## 2013-03-14 NOTE — Telephone Encounter (Signed)
Electronic refill request. Patient was only given 1 RF at last request.  Please advise.

## 2013-03-15 MED ORDER — FLUOXETINE HCL 20 MG PO TABS: 20.0000 mg | ORAL_TABLET | Freq: Every day | ORAL | Status: DC

## 2013-03-15 NOTE — Telephone Encounter (Signed)
Sent. Thanks.   

## 2013-04-19 ENCOUNTER — Other Ambulatory Visit: Payer: Self-pay | Admitting: Family Medicine

## 2013-04-19 MED ORDER — DIAZEPAM 5 MG PO TABS: 2.5000 mg | ORAL_TABLET | Freq: Three times a day (TID) | ORAL | Status: DC | PRN

## 2013-04-19 NOTE — Telephone Encounter (Signed)
Medication phoned to pharmacy.  

## 2013-04-19 NOTE — Telephone Encounter (Signed)
Fax refill request, please advise  

## 2013-04-19 NOTE — Telephone Encounter (Signed)
Please call in.  Thanks.   

## 2013-05-03 ENCOUNTER — Ambulatory Visit (INDEPENDENT_AMBULATORY_CARE_PROVIDER_SITE_OTHER): Payer: BC Managed Care – PPO | Admitting: Family Medicine

## 2013-05-03 ENCOUNTER — Encounter: Payer: Self-pay | Admitting: Family Medicine

## 2013-05-03 VITALS — BP 128/82 | HR 64 | Temp 98.0°F | Ht 69.0 in | Wt 178.2 lb

## 2013-05-03 DIAGNOSIS — E785 Hyperlipidemia, unspecified: Secondary | ICD-10-CM

## 2013-05-03 DIAGNOSIS — Z1211 Encounter for screening for malignant neoplasm of colon: Secondary | ICD-10-CM

## 2013-05-03 DIAGNOSIS — F411 Generalized anxiety disorder: Secondary | ICD-10-CM

## 2013-05-03 DIAGNOSIS — F418 Other specified anxiety disorders: Secondary | ICD-10-CM

## 2013-05-03 MED ORDER — FLUOXETINE HCL 20 MG PO CAPS: 20.0000 mg | ORAL_CAPSULE | Freq: Every day | ORAL | Status: DC

## 2013-05-03 MED ORDER — PRAVASTATIN SODIUM 40 MG PO TABS: ORAL_TABLET | ORAL | Status: DC

## 2013-05-03 NOTE — Progress Notes (Signed)
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  He is working mult part time jobs.  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  No CP.  No SOB.  His situation is getting better since he restarted work.  He thinks he will be able to keep the house and land.  "I got screwed but it isn't the end of the world." he tried to come off the prozac and his mood was affected.  He wanted to continue it. Discussed.  Rare use of valium. No SI/HI.    Some L arm pain, better with aleve but it upset his stomach.  It was driving long distance and the pain started.  It may have been positional.  No pain now.    Tetanus 2009 PNA shot in 2013.  Flu encouraged.   D/w patient [id number] for colon cancer screening, including IFOB vs. colonoscopy.  Risks and benefits of both were discussed and patient voiced understanding.  Pt elects for: IFOB.   Prostate cancer screening and PSA options (with potential risks and benefits of testing vs not testing) were discussed along with recent recs/guidelines.  He declined testing PSA at this point.  Elevated Cholesterol: Using medications without problems:yes Muscle aches: see above Diet compliance: discussed Exercise:discussed  Meds, vitals, and allergies reviewed.   ROS: See HPI.  Otherwise, noncontributory.  GEN: nad, alert and oriented HEENT: mucous membranes moist NECK: supple w/o LA CV: rrr.  no murmur PULM: ctab, no inc wob ABD: soft, +bs EXT: no edema SKIN: no acute rash L shoulder with normal ROM and no weakness,no impingement.

## 2013-05-03 NOTE — Patient Instructions (Addendum)
I would get a flu shot each fall.  See if it is cheaper here or at the pharmacy.   Take care.  Try to cut back on salt and ease back into exercising.

## 2013-05-04 NOTE — Assessment & Plan Note (Signed)
Situational, this may improve in the near future.  No SI/HI, continue current meds.  He agrees.  Call back as needed.

## 2013-05-04 NOTE — Assessment & Plan Note (Signed)
We can check lipids later in the year.  Continue current dose for now.  No ADE. I think the arm pain was positional and no statin related.

## 2013-05-07 ENCOUNTER — Other Ambulatory Visit (INDEPENDENT_AMBULATORY_CARE_PROVIDER_SITE_OTHER): Payer: Self-pay

## 2013-05-07 DIAGNOSIS — Z1211 Encounter for screening for malignant neoplasm of colon: Secondary | ICD-10-CM

## 2013-05-08 ENCOUNTER — Encounter: Payer: Self-pay | Admitting: *Deleted

## 2013-11-20 ENCOUNTER — Telehealth: Payer: Self-pay

## 2013-11-20 NOTE — Telephone Encounter (Signed)
Pt has started a new job and needs copy of 05/03/13 visit. Pt will come by office and sign record release and pick up copy of visit.

## 2014-03-24 ENCOUNTER — Ambulatory Visit (INDEPENDENT_AMBULATORY_CARE_PROVIDER_SITE_OTHER): Payer: 59 | Admitting: Internal Medicine

## 2014-03-24 ENCOUNTER — Encounter: Payer: Self-pay | Admitting: Internal Medicine

## 2014-03-24 VITALS — BP 128/80 | HR 77 | Temp 97.7°F | Wt 182.0 lb

## 2014-03-24 DIAGNOSIS — IMO0002 Reserved for concepts with insufficient information to code with codable children: Secondary | ICD-10-CM

## 2014-03-24 DIAGNOSIS — M546 Pain in thoracic spine: Secondary | ICD-10-CM

## 2014-03-24 DIAGNOSIS — M792 Neuralgia and neuritis, unspecified: Secondary | ICD-10-CM

## 2014-03-24 MED ORDER — PREDNISONE 10 MG PO TABS: ORAL_TABLET | ORAL | Status: DC

## 2014-03-24 NOTE — Progress Notes (Signed)
Subjective:    Patient ID: [name] [name], male    DOB: 05-05-1956, 58 y.o.   MRN: [id number]  HPI  Pt presents to the clinic today with c/o back pain. He reports the pain is in the middle of his upper back. The pain radiates down his left arm. It is constant. He describes it as a sharp, shooting sensation. He does have associated numbness in his hand. He denies chest pain, chest tightness, shortness of breath or reflux. He has tried massage without any relief. He denies any specific injury to the area. He does have a history of L5-S1 laminectomy x 2.   Review of Systems      Past Medical History  Diagnosis Date  . Hyperlipidemia   . Palpitations     secondary to PVCs  . Nephrolithiasis   . Nonspecific abnormal results of liver function study   . Impotence of organic origin   . Trigger finger (acquired)     Right, middle  . Kidney stones     Current Outpatient Prescriptions  Medication Sig Dispense Refill  . aspirin 81 MG tablet Take 81 mg by mouth daily.      . Glucosamine-Chondroit-Vit C-Mn (GLUCOSAMINE 1500 COMPLEX) CAPS Take 1 capsule by mouth daily.        . Omega-3 Fatty Acids (FISH OIL) 1000 MG CAPS Take 1 capsule by mouth 3 (three) times daily.        . pravastatin (PRAVACHOL) 40 MG tablet TAKE  ONE TABLET BY MOUTH NIGHTLY AT BEDTIME  90 tablet  3   No current facility-administered medications for this visit.    No Known Allergies  Family History  Problem Relation Age of Onset  . Diabetes Mother   . Hypertension Mother   . Heart disease Neg Hx   . Cancer Neg Hx   . Prostate cancer Neg Hx   . Colon cancer Neg Hx     History   Social History  . Marital Status: Married    Spouse Name: N/A    Number of Children: 1  . Years of Education: N/A   Occupational History  . [employer]    Social History Main Topics  . Smoking status: Never Smoker   . Smokeless tobacco: Never Used  . Alcohol Use: No  . Drug Use: No  . Sexual Activity: Not on file    Other Topics Concern  . Not on file   Social History Narrative   Lives with wife, married 1993   Likes to travel with RV   1 step-son   [employer]     Constitutional: Denies fever, malaise, fatigue, headache or abrupt weight changes.  Respiratory: Denies difficulty breathing, shortness of breath, cough or sputum production.   Cardiovascular: Denies chest pain, chest tightness, palpitations or swelling in the hands or feet.  Musculoskeletal: Pt reports back pain.  No other specific complaints in a complete review of systems (except as listed in HPI above).  Objective:   Physical Exam  BP 128/80  Pulse 77  Temp(Src) 97.7 F (36.5 C) (Tympanic)  Wt 182 lb (82.555 kg)  SpO2 97% Wt Readings from Last 3 Encounters:  03/24/14 182 lb (82.555 kg)  05/03/13 178 lb 4 oz (80.854 kg)  06/29/12 172 lb 12 oz (78.359 kg)    General: Appears his stated age, well developed, well nourished in NAD. Cardiovascular: Normal rate and rhythm. S1,S2 noted.  No murmur, rubs or gallops noted. No JVD or BLE edema. No  carotid bruits noted. Pulmonary/Chest: Normal effort and positive vesicular breath sounds. No respiratory distress. No wheezes, rales or ronchi noted.  Musculoskeletal: Pain with palpation along the medial border of the left scapula. Muscles tense in the trapezius. Normal flexion, extension and rotation of the cervical spine. Normal felxion, extension and rotation of the lumber spine.    BMET    Component Value Date/Time   NA 139 11/07/2011 0812   K 4.5 11/07/2011 0812   CL 104 11/07/2011 0812   CO2 27 11/07/2011 0812   GLUCOSE 104* 11/07/2011 0812   BUN 17 11/07/2011 0812   CREATININE 1.1 11/07/2011 0812   CALCIUM 9.3 11/07/2011 0812   GFRNONAA 68.01 07/22/2010 0824   GFRAA 90 10/19/2007 0946    Lipid Panel     Component Value Date/Time   CHOL 157 11/07/2011 0812   TRIG 101.0 11/07/2011 0812   HDL 44.80 11/07/2011 0812   CHOLHDL 4 11/07/2011 0812   VLDL 20.2 11/07/2011 0812    LDLCALC 92 11/07/2011 0812    CBC    Component Value Date/Time   WBC 4.4* 10/19/2007 0946   RBC 4.52 10/19/2007 0946   HGB 15.2 10/19/2007 0946   HCT 44.6 10/19/2007 0946   PLT 136* 10/19/2007 0946   MCV 98.6 10/19/2007 0946   MCHC 34.2 10/19/2007 0946   RDW 12.5 10/19/2007 0946   LYMPHSABS 0.9 02/20/2007 1445   MONOABS 0.6 10/19/2007 0946   EOSABS 0.1 10/19/2007 0946   BASOSABS 0.0 10/19/2007 0946    Hgb A1C No results found for this basename: HGBA1C         Assessment & Plan:   Back pain with radiculopathy:  eRx for pred taper Try the stretching exercises I have given you A heating pad or ice may be helpful  RTC as needed or if pain persist or worsens

## 2014-03-24 NOTE — Patient Instructions (Addendum)

## 2014-03-24 NOTE — Progress Notes (Signed)
Pre visit review using our clinic review tool, if applicable. No additional management support is needed unless otherwise documented below in the visit note. 

## 2014-04-02 ENCOUNTER — Ambulatory Visit (INDEPENDENT_AMBULATORY_CARE_PROVIDER_SITE_OTHER): Payer: 59 | Admitting: Family Medicine

## 2014-04-02 ENCOUNTER — Encounter: Payer: Self-pay | Admitting: Family Medicine

## 2014-04-02 ENCOUNTER — Ambulatory Visit (INDEPENDENT_AMBULATORY_CARE_PROVIDER_SITE_OTHER)
Admission: RE | Admit: 2014-04-02 | Discharge: 2014-04-02 | Disposition: A | Discharge status: Home / Self Care | Payer: 59 | Source: Ambulatory Visit | Attending: Family Medicine | Admitting: Family Medicine

## 2014-04-02 VITALS — BP 118/70 | HR 78 | Temp 98.0°F | Wt 186.5 lb

## 2014-04-02 DIAGNOSIS — IMO0002 Reserved for concepts with insufficient information to code with codable children: Secondary | ICD-10-CM

## 2014-04-02 DIAGNOSIS — M792 Neuralgia and neuritis, unspecified: Secondary | ICD-10-CM

## 2014-04-02 MED ORDER — PREDNISONE 10 MG PO TABS: ORAL_TABLET | ORAL | Status: DC

## 2014-04-02 NOTE — Patient Instructions (Signed)
xrays on the way out today.  Start the prednisone, no ibuprofen with the steroids.  Take pred with food.  We'll be in touch about the xray results.  Notify us if not better.  Take care.

## 2014-04-02 NOTE — Progress Notes (Signed)
Pre visit review using our clinic review tool, if applicable. No additional management support is needed unless otherwise documented below in the visit note.  He is still working on his work situation.  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 This isn't easy for him.  He's trying to keep his home, but a lot is still to be determined.    Was on pred taper helped some.  He was prev nearly in tears.  Now better but still with sig AM pain. Taking advil in the meantime.  If he extends his neck, looking up, and tilts his head to the R, then he reproduces the pain down the L arm.  L 4th and 5th fingers go numb.  No weakness unless he has a pain flare.  No trauma to cause this recently.  He had fallen months ago, but that didn't seem to be related.  He recently was doing some heavy lifting and it is unclear if that contributed.    Off pred for a few days now.    Meds, vitals, and allergies reviewed.   ROS: See HPI.  Otherwise, noncontributory.  nad ncat Neck supple, normal ROM but pain with looking up and to the R, that radiates down into his L arm rrr ctab S/S wnl for the BUE, normal BUE DTRs Gait wnl Neck w/o midline pain posteriorly

## 2014-04-03 DIAGNOSIS — M792 Neuralgia and neuritis, unspecified: Secondary | ICD-10-CM | POA: Insufficient documentation

## 2014-04-03 NOTE — Assessment & Plan Note (Signed)
Likely source in the neck. D/w pt.  Check plain films today, restart pred taper since he is improved. No emergent sx. If not improved, then will likely need MRI and surgery referral. He agrees.  Okay for outpatient f/u.

## 2014-05-13 ENCOUNTER — Telehealth: Payer: Self-pay | Admitting: Family Medicine

## 2014-05-13 NOTE — Telephone Encounter (Signed)
Pt called requesting Hepatitis vaccine due to him starting to work at a [employer]. He is unsure of hep a or hep b. Pt states that to his knowledge he has never had either one. Please advise!  Pt c/b # [phone]

## 2014-05-13 NOTE — Telephone Encounter (Signed)
Please get him on the nurse schedule for twinrx (Hep A/B combo) schedule.  He'll need the full series of shots. Thanks.

## 2014-05-14 NOTE — Telephone Encounter (Signed)
Immunization form completed to be signed and place on your desk. Please complete form and forward this message to [facility] and she said that she will call the patient and get him on her schedule.

## 2014-05-14 NOTE — Telephone Encounter (Signed)
Form signed, thanks.

## 2014-05-14 NOTE — Telephone Encounter (Signed)
Pt scheduled appt for 05/29/14; signed immunization form at Jermine Bibbee's desk in pending immunization folder.pt will schedule rest of series at later time.

## 2014-05-29 ENCOUNTER — Ambulatory Visit (INDEPENDENT_AMBULATORY_CARE_PROVIDER_SITE_OTHER): Payer: 59

## 2014-05-29 DIAGNOSIS — Z23 Encounter for immunization: Secondary | ICD-10-CM

## 2014-06-01 ENCOUNTER — Other Ambulatory Visit: Payer: Self-pay | Admitting: Family Medicine

## 2014-07-29 ENCOUNTER — Ambulatory Visit (INDEPENDENT_AMBULATORY_CARE_PROVIDER_SITE_OTHER): Payer: 59

## 2014-07-29 DIAGNOSIS — Z23 Encounter for immunization: Secondary | ICD-10-CM

## 2014-07-30 ENCOUNTER — Other Ambulatory Visit: Payer: Self-pay | Admitting: Family Medicine

## 2014-07-30 DIAGNOSIS — E785 Hyperlipidemia, unspecified: Secondary | ICD-10-CM

## 2014-08-01 ENCOUNTER — Other Ambulatory Visit (INDEPENDENT_AMBULATORY_CARE_PROVIDER_SITE_OTHER): Payer: 59

## 2014-08-01 DIAGNOSIS — E785 Hyperlipidemia, unspecified: Secondary | ICD-10-CM

## 2014-08-01 LAB — COMPREHENSIVE METABOLIC PANEL
ALK PHOS: 61 U/L (ref 39–117)
ALT: 28 U/L (ref 0–53)
AST: 26 U/L (ref 0–37)
Albumin: 4 g/dL (ref 3.5–5.2)
BILIRUBIN TOTAL: 0.9 mg/dL (ref 0.2–1.2)
BUN: 19 mg/dL (ref 6–23)
CO2: 25 mEq/L (ref 19–32)
Calcium: 9.1 mg/dL (ref 8.4–10.5)
Chloride: 107 mEq/L (ref 96–112)
Creatinine, Ser: 1.1 mg/dL (ref 0.4–1.5)
GFR: 71.21 mL/min (ref >60.00)
GLUCOSE: 101 mg/dL — AB (ref 70–99)
Potassium: 4.2 mEq/L (ref 3.5–5.1)
SODIUM: 136 meq/L (ref 135–145)
Total Protein: 6.6 g/dL (ref 6.0–8.3)

## 2014-08-01 LAB — LIPID PANEL
CHOLESTEROL: 137 mg/dL (ref 0–200)
HDL: 35.7 mg/dL — AB (ref >39.00)
LDL CALC: 82 mg/dL (ref 0–99)
NonHDL: 101.3
TRIGLYCERIDES: 97 mg/dL (ref 0.0–149.0)
Total CHOL/HDL Ratio: 4
VLDL: 19.4 mg/dL (ref 0.0–40.0)

## 2014-08-07 ENCOUNTER — Encounter: Payer: 59 | Admitting: Family Medicine

## 2014-08-12 ENCOUNTER — Encounter: Payer: Self-pay | Admitting: Family Medicine

## 2014-08-12 ENCOUNTER — Ambulatory Visit (INDEPENDENT_AMBULATORY_CARE_PROVIDER_SITE_OTHER): Payer: 59 | Admitting: Family Medicine

## 2014-08-12 VITALS — BP 122/76 | HR 68 | Temp 98.1°F | Ht 69.0 in | Wt 183.0 lb

## 2014-08-12 DIAGNOSIS — Z23 Encounter for immunization: Secondary | ICD-10-CM

## 2014-08-12 DIAGNOSIS — Z7189 Other specified counseling: Secondary | ICD-10-CM

## 2014-08-12 DIAGNOSIS — E785 Hyperlipidemia, unspecified: Secondary | ICD-10-CM

## 2014-08-12 DIAGNOSIS — Z1211 Encounter for screening for malignant neoplasm of colon: Secondary | ICD-10-CM

## 2014-08-12 DIAGNOSIS — Z0000 Encounter for general adult medical examination without abnormal findings: Secondary | ICD-10-CM

## 2014-08-12 MED ORDER — PRAVASTATIN SODIUM 40 MG PO TABS: 40.0000 mg | ORAL_TABLET | Freq: Every day | ORAL | Status: DC

## 2014-08-12 NOTE — Progress Notes (Signed)
Pre visit review using our clinic review tool, if applicable. No additional management support is needed unless otherwise documented below in the visit note.  CPE- See plan.  Routine anticipatory guidance given to patient.  See health maintenance. Tetanus 2009 Shingles and PNA not due.  Flu shot today.  Prostate cancer screening and PSA options (with potential risks and benefits of testing vs not testing) were discussed along with recent recs/guidelines.   He declined testing PSA at this point. D/w patient [id number] for colon cancer screening, including IFOB vs. colonoscopy.  Risks and benefits of both were discussed and patient voiced understanding.   Pt elects for: IFOB.   Living will d/w pt.  Wife designated if patient were incapacitated.  Diet and exercise d/w pt.  Doing as well as he can with working 7 days week.   He has noted some occ bowel/BM irregularity, "used to be like clockwork."  No blood in stool.  No diet changes.  No red flag sx.  No abd pain.  We agreed to monitor this for now and he'll inc his fiber intake.   Elevated Cholesterol: Using medications without problems:yes Muscle aches: no Diet compliance:yes Exercise: see above  PMH and SH reviewed  Meds, vitals, and allergies reviewed.   ROS: See HPI.  Otherwise negative.    GEN: nad, alert and oriented HEENT: mucous membranes moist NECK: supple w/o LA CV: rrr. PULM: ctab, no inc wob ABD: soft, +bs EXT: no edema SKIN: no acute rash

## 2014-08-12 NOTE — Patient Instructions (Addendum)
Check with your insurance to see if they will cover the shingles shot when you turn 59.  Go to the lab on the way out.  We'll contact you with your lab report. Take care. Glad to see you.

## 2014-08-13 DIAGNOSIS — Z7189 Other specified counseling: Secondary | ICD-10-CM | POA: Insufficient documentation

## 2014-08-13 NOTE — Assessment & Plan Note (Signed)
Routine anticipatory guidance given to patient.  See health maintenance. Tetanus 2009 Shingles and PNA not due.  Flu shot today.  Prostate cancer screening and PSA options (with potential risks and benefits of testing vs not testing) were discussed along with recent recs/guidelines.   He declined testing PSA at this point. D/w patient [id number] for colon cancer screening, including IFOB vs. colonoscopy.  Risks and benefits of both were discussed and patient voiced understanding.   Pt elects for: IFOB.   Living will d/w pt.  Wife designated if patient were incapacitated.  Diet and exercise d/w pt.  Doing as well as he can with working 7 days week.   He has noted some occ bowel/BM irregularity, "used to be like clockwork."  No blood in stool.  No diet changes.  No red flag sx.  No abd pain.  We agreed to monitor this for now and he'll inc his fiber intake.

## 2014-08-13 NOTE — Assessment & Plan Note (Signed)
Not at the point of needing a med change, will work on diet and exercise as his schedule allows. D/w pt about lipids and sugar along with other labs.  He agrees.

## 2014-08-27 ENCOUNTER — Encounter: Payer: Self-pay | Admitting: *Deleted

## 2014-08-27 ENCOUNTER — Other Ambulatory Visit (INDEPENDENT_AMBULATORY_CARE_PROVIDER_SITE_OTHER): Payer: Self-pay

## 2014-08-27 DIAGNOSIS — Z1211 Encounter for screening for malignant neoplasm of colon: Secondary | ICD-10-CM

## 2014-08-27 LAB — FECAL OCCULT BLOOD, IMMUNOCHEMICAL: Fecal Occult Bld: NEGATIVE

## 2014-09-08 ENCOUNTER — Other Ambulatory Visit: Payer: Self-pay | Admitting: Family Medicine

## 2015-01-28 ENCOUNTER — Ambulatory Visit: Payer: 59

## 2015-10-12 ENCOUNTER — Other Ambulatory Visit: Payer: Self-pay | Admitting: Family Medicine

## 2015-10-12 NOTE — Telephone Encounter (Signed)
Electronic refill request. Last Filled:    90 tablet 3 09/08/2014  Last office visit:   08/12/14  CPE    Please advise.

## 2015-10-12 NOTE — Telephone Encounter (Signed)
Sent.  CPE when possible.  Thanks.  

## 2015-10-13 NOTE — Telephone Encounter (Signed)
Left detailed message on voicemail.  

## 2016-05-10 ENCOUNTER — Telehealth: Payer: Self-pay | Admitting: Family Medicine

## 2016-05-10 NOTE — Telephone Encounter (Signed)
Patient not seen since 08/12/14. Last Filled:    90 tablet 1 10/12/2015  Please advise.

## 2016-05-11 NOTE — Telephone Encounter (Signed)
Please schedule CPE as instructed. 

## 2016-05-11 NOTE — Telephone Encounter (Signed)
Sent.  Needs CPE scheduled.  Thanks.  

## 2016-05-12 ENCOUNTER — Encounter: Payer: Self-pay | Admitting: Family Medicine

## 2016-05-12 NOTE — Telephone Encounter (Signed)
Called number on chart A lady answered stating this is the 2nd time she has received a call from [place].  Pt phone number incorrect Mailed letter asking pt to call office

## 2016-08-10 ENCOUNTER — Other Ambulatory Visit: Payer: Self-pay | Admitting: Family Medicine

## 2016-08-10 NOTE — Telephone Encounter (Signed)
Has OV scheduled.  Sent.  Thanks.

## 2016-08-10 NOTE — Telephone Encounter (Signed)
Last office visit 08/12/14 See letter mailed to patient 05/12/2016

## 2016-08-11 ENCOUNTER — Telehealth: Payer: Self-pay | Admitting: Family Medicine

## 2016-08-11 NOTE — Telephone Encounter (Signed)
Noted  

## 2016-08-11 NOTE — Telephone Encounter (Signed)
[facility] Patient Name: [name] [name] DOB: 12/15/1955 Initial Comment husband pain on left side, had kidney stones in the past. Nurse Assessment Nurse: Andria Frames, RN, Aeriel Date/Time (Eastern Time): 08/11/2016 9:53:50 AM Confirm and document reason for call. If symptomatic, describe symptoms. ---Caller states, he has been having lower left sided back pain since Tuesday. He has a history of Kidney stones. Caller states, his pain is a 5 out of 10. Caller denies fever. Does the patient have any new or worsening symptoms? ---Yes Will a triage be completed? ---Yes Related visit to physician within the last 2 weeks? ---No Does the PT have any chronic conditions? (i.e. diabetes, asthma, etc.) ---No Is this a behavioral health or substance abuse call? ---No Guidelines Guideline Title Affirmed Question Affirmed Notes Back Pain [1] MODERATE back pain (e.g., interferes with normal activities) AND [2] present > 3 days Final Disposition User See Physician within 4 Hours (or PCP triage) Hensel, RN, Aeriel Comments Nurse upgrade related to pt has had blood in the uring recently but not currently he also has a history of kidney stones and he believes that is what it is. Nurse attempted to schedule at multiple offices no appt found. Nurse also referred caller to [facility]. Referrals [facility] Disagree/Comply: Comply

## 2016-09-13 ENCOUNTER — Encounter: Payer: Self-pay | Admitting: Family Medicine

## 2016-09-13 ENCOUNTER — Ambulatory Visit (INDEPENDENT_AMBULATORY_CARE_PROVIDER_SITE_OTHER): Payer: 59 | Admitting: Family Medicine

## 2016-09-13 VITALS — BP 126/78 | HR 66 | Temp 98.5°F | Ht 68.5 in | Wt 175.0 lb

## 2016-09-13 DIAGNOSIS — Z23 Encounter for immunization: Secondary | ICD-10-CM

## 2016-09-13 DIAGNOSIS — E785 Hyperlipidemia, unspecified: Secondary | ICD-10-CM | POA: Diagnosis not present

## 2016-09-13 DIAGNOSIS — I493 Ventricular premature depolarization: Secondary | ICD-10-CM

## 2016-09-13 DIAGNOSIS — Z119 Encounter for screening for infectious and parasitic diseases, unspecified: Secondary | ICD-10-CM

## 2016-09-13 DIAGNOSIS — L918 Other hypertrophic disorders of the skin: Secondary | ICD-10-CM

## 2016-09-13 DIAGNOSIS — Z0000 Encounter for general adult medical examination without abnormal findings: Secondary | ICD-10-CM

## 2016-09-13 DIAGNOSIS — Z1211 Encounter for screening for malignant neoplasm of colon: Secondary | ICD-10-CM

## 2016-09-13 DIAGNOSIS — N529 Male erectile dysfunction, unspecified: Secondary | ICD-10-CM

## 2016-09-13 LAB — COMPREHENSIVE METABOLIC PANEL
ALT: 30 U/L (ref 0–53)
AST: 24 U/L (ref 0–37)
Albumin: 4.4 g/dL (ref 3.5–5.2)
Alkaline Phosphatase: 65 U/L (ref 39–117)
BUN: 15 mg/dL (ref 6–23)
CO2: 31 mEq/L (ref 19–32)
CREATININE: 1.19 mg/dL (ref 0.40–1.50)
Calcium: 9.5 mg/dL (ref 8.4–10.5)
Chloride: 106 mEq/L (ref 96–112)
GFR: 65.93 mL/min (ref >60.00)
GLUCOSE: 101 mg/dL — AB (ref 70–99)
POTASSIUM: 4.7 meq/L (ref 3.5–5.1)
SODIUM: 139 meq/L (ref 135–145)
Total Bilirubin: 0.7 mg/dL (ref 0.2–1.2)
Total Protein: 7.1 g/dL (ref 6.0–8.3)

## 2016-09-13 LAB — LIPID PANEL
CHOL/HDL RATIO: 3
Cholesterol: 149 mg/dL (ref 0–200)
HDL: 44.8 mg/dL (ref >39.00)
LDL Cholesterol: 87 mg/dL (ref 0–99)
NONHDL: 104.11
Triglycerides: 88 mg/dL (ref 0.0–149.0)
VLDL: 17.6 mg/dL (ref 0.0–40.0)

## 2016-09-13 MED ORDER — SILDENAFIL CITRATE 20 MG PO TABS: 60.0000 mg | ORAL_TABLET | Freq: Every day | ORAL | 12 refills | Status: DC | PRN

## 2016-09-13 MED ORDER — PRAVASTATIN SODIUM 40 MG PO TABS: 40.0000 mg | ORAL_TABLET | Freq: Every day | ORAL | 3 refills | Status: DC

## 2016-09-13 NOTE — Assessment & Plan Note (Signed)
Tetanus 2009 Shingles d/w pt.   PNA not due.  Flu shot today.  Prostate cancer screening and PSA options (with potential risks and benefits of testing vs not testing) were discussed along with recent recs/guidelines.  He declined testing PSA at this point. D/w patient [id number] for colon cancer screening, including IFOB vs. colonoscopy.  Risks and benefits of both were discussed and patient voiced understanding.   Pt elects for: IFOB.   Living will d/w pt.  Wife designated if patient were incapacitated.  Diet and exercise d/w pt.  Doing as well as he can with working ~7 days week.   He has noted some occ bowel/BM irregularity, "used to be like clockwork." occ has a day w/u a BM.  No blood in stool.  No diet changes.  No red flag sx.  No abd pain.  We still agreed to monitor this for now and he'll inc his fiber intake.  Labs pending.  Pt opts in for HCV screening.  D/w pt re: routine screening.   Pt opts in for HIV screening.  D/w pt re: routine screening.

## 2016-09-13 NOTE — Assessment & Plan Note (Addendum)
See notes on labs.  Continue statin.  Continue diet and exercise.

## 2016-09-13 NOTE — Assessment & Plan Note (Signed)
Okay to try sildenafil.  D/w pt.  rx sent.

## 2016-09-13 NOTE — Assessment & Plan Note (Signed)
Return for removal.  

## 2016-09-13 NOTE — Progress Notes (Signed)
Pre visit review using our clinic review tool, if applicable. No additional management support is needed unless otherwise documented below in the visit note. 

## 2016-09-13 NOTE — Progress Notes (Signed)
CPE- See plan.  Routine anticipatory guidance given to patient.  See health maintenance. Tetanus 2009 Shingles d/w pt.   PNA not due.  Flu shot today.  Prostate cancer screening and PSA options (with potential risks and benefits of testing vs not testing) were discussed along with recent recs/guidelines.  He declined testing PSA at this point. D/w patient [id number] for colon cancer screening, including IFOB vs. colonoscopy.  Risks and benefits of both were discussed and patient voiced understanding.   Pt elects for: IFOB.   Living will d/w pt.  Wife designated if patient were incapacitated.  Diet and exercise d/w pt.  Doing as well as he can with working ~7 days week.   He has noted some occ bowel/BM irregularity, "used to be like clockwork." occ has a day w/u a BM.  No blood in stool.  No diet changes.  No red flag sx.  No abd pain.  We still agreed to monitor this for now and he'll inc his fiber intake.  Labs pending.  Pt opts in for HCV screening.  D/w pt re: routine screening.   Pt opts in for HIV screening.  D/w pt re: routine screening.    Elevated Cholesterol: Using medications without problems:yes Muscle aches: no Diet compliance: yes Exercise:yes Due for labs.    ED.  Asking about tx.  Partial function.  No ADE on cialis prev.  D/w pt about trial of sildenafil.    PMH and SH reviewed  Meds, vitals, and allergies reviewed.   ROS: Per HPI.  Unless specifically indicated otherwise in HPI, the patient denies:  General: fever. Eyes: acute vision changes ENT: sore throat Cardiovascular: chest pain Respiratory: SOB GI: vomiting GU: dysuria Musculoskeletal: acute back pain Derm: acute rash Neuro: acute motor dysfunction Psych: worsening mood Endocrine: polydipsia Heme: bleeding Allergy: hayfever  GEN: nad, alert and oriented HEENT: mucous membranes moist NECK: supple w/o LA CV: rrr. PULM: ctab, no inc wob ABD: soft, +bs EXT: no edema SKIN: no acute rash Mult SKs  noted, d/w pt. Mult tags noted.  Small benign papular lesion noted under R eyelid, <1cm.  D/w pt.

## 2016-09-13 NOTE — Patient Instructions (Addendum)
Check with your insurance to see if they will cover the shingles shot. Ask for a 30 min appointment for me to take off skin tags.   Go to the lab on the way out.  We'll contact you with your lab report. Take care.  Glad to see you.  Update me as needed.

## 2016-09-13 NOTE — Assessment & Plan Note (Signed)
Pt noted dec in sx overall- usually only noted when he has too much caffeine.  D/w pt.  Sounds regular today.

## 2016-09-14 LAB — HIV ANTIBODY (ROUTINE TESTING W REFLEX): HIV 1&2 Ab, 4th Generation: NONREACTIVE

## 2016-09-14 LAB — HEPATITIS C ANTIBODY: HCV AB: NEGATIVE

## 2016-09-16 ENCOUNTER — Encounter: Payer: Self-pay | Admitting: Family Medicine

## 2016-09-16 ENCOUNTER — Ambulatory Visit (INDEPENDENT_AMBULATORY_CARE_PROVIDER_SITE_OTHER): Payer: 59 | Admitting: Family Medicine

## 2016-09-16 DIAGNOSIS — L918 Other hypertrophic disorders of the skin: Secondary | ICD-10-CM

## 2016-09-16 NOTE — Assessment & Plan Note (Signed)
18 removed.  Tolerated well  Routine postprocedure instructions d/w pt, keep area clean and bandaged, follow up if concerns/spreading erythema/pain.  He agrees.   No complications.

## 2016-09-16 NOTE — Patient Instructions (Signed)
Keep the spots clean, cover as needed.  Update me as needed.  Take care.  Glad to see you.

## 2016-09-16 NOTE — Progress Notes (Signed)
Skin tag removal.   Meds, vitals, and allergies reviewed.   Indication: irritated skin tags  Pt complaints [id number]   Location: B neck  Size: all <1cm  Informed consent obtained.  Pt aware of risks not limited to but including infection, bleeding, damage to near by organs.  Prep: etoh  Anesthesia: 1%lidocaine with epi, good effect  All snipped with scissors.  8 on R side of neck and 10 on L side of neck.   Tolerated well  Routine postprocedure instructions d/w pt, keep area clean and bandaged, follow up if concerns/spreading erythema/pain.

## 2016-09-28 ENCOUNTER — Other Ambulatory Visit (INDEPENDENT_AMBULATORY_CARE_PROVIDER_SITE_OTHER): Payer: 59

## 2016-09-28 DIAGNOSIS — Z1211 Encounter for screening for malignant neoplasm of colon: Secondary | ICD-10-CM

## 2016-09-28 LAB — FECAL OCCULT BLOOD, IMMUNOCHEMICAL: Fecal Occult Bld: NEGATIVE

## 2016-09-29 ENCOUNTER — Encounter: Payer: Self-pay | Admitting: *Deleted

## 2016-11-30 MED ORDER — DIPHENHYDRAMINE HCL 25 MG PO CAPS: 25.0000 mg | ORAL_CAPSULE | ORAL | Status: AC

## 2016-11-30 MED ORDER — MIDAZOLAM HCL 2 MG/2ML IJ SOLN: 1.0000 mg | Freq: Once | INTRAMUSCULAR | Status: DC

## 2016-11-30 MED ORDER — LEVOFLOXACIN 500 MG PO TABS: 500.0000 mg | ORAL_TABLET | ORAL | Status: DC

## 2016-12-01 SURGERY — LITHOTRIPSY, ESWL
Anesthesia: Moderate Sedation | Laterality: Left

## 2016-12-01 MED ORDER — DIPHENHYDRAMINE HCL 25 MG PO CAPS
ORAL_CAPSULE | ORAL | Status: AC
  Filled 2016-12-01: qty 1

## 2016-12-01 MED ORDER — MORPHINE SULFATE (PF) 10 MG/ML IV SOLN
INTRAVENOUS | Status: AC
  Filled 2016-12-01: qty 1

## 2016-12-01 MED ORDER — LEVOFLOXACIN 500 MG PO TABS
ORAL_TABLET | ORAL | Status: AC
  Filled 2016-12-01: qty 1

## 2016-12-01 MED ORDER — MIDAZOLAM HCL 2 MG/2ML IJ SOLN
INTRAMUSCULAR | Status: AC
  Filled 2016-12-01: qty 2

## 2016-12-07 ENCOUNTER — Ambulatory Visit: Admission: RE | Admit: 2016-12-07 | Payer: 59 | Source: Ambulatory Visit | Admitting: Urology

## 2016-12-08 ENCOUNTER — Encounter: Payer: Self-pay | Admitting: *Deleted

## 2016-12-08 ENCOUNTER — Ambulatory Visit
Admission: RE | Admit: 2016-12-08 | Discharge: 2016-12-08 | Disposition: A | Discharge status: Home / Self Care | Payer: Commercial Managed Care - HMO | Source: Ambulatory Visit | Attending: Urology | Admitting: Urology

## 2016-12-08 ENCOUNTER — Encounter
Admission: RE | Disposition: A | Discharge status: Home / Self Care | Payer: Self-pay | Source: Ambulatory Visit | Attending: Urology

## 2016-12-08 DIAGNOSIS — N201 Calculus of ureter: Secondary | ICD-10-CM | POA: Diagnosis not present

## 2016-12-08 DIAGNOSIS — Z79899 Other long term (current) drug therapy: Secondary | ICD-10-CM | POA: Diagnosis not present

## 2016-12-08 DIAGNOSIS — E7800 Pure hypercholesterolemia, unspecified: Secondary | ICD-10-CM | POA: Insufficient documentation

## 2016-12-08 DIAGNOSIS — Z87442 Personal history of urinary calculi: Secondary | ICD-10-CM | POA: Diagnosis not present

## 2016-12-08 HISTORY — PX: EXTRACORPOREAL SHOCK WAVE LITHOTRIPSY: SHX1557

## 2016-12-08 SURGERY — LITHOTRIPSY, ESWL
Anesthesia: Moderate Sedation | Laterality: Left

## 2016-12-08 MED ORDER — FUROSEMIDE 10 MG/ML IJ SOLN
10.0000 mg | Freq: Once | INTRAMUSCULAR | Status: AC
  Administered 2016-12-08: 10 mg via INTRAVENOUS

## 2016-12-08 MED ORDER — NUCYNTA 50 MG PO TABS: 50.0000 mg | ORAL_TABLET | Freq: Four times a day (QID) | ORAL | 0 refills | Status: DC | PRN

## 2016-12-08 MED ORDER — MIDAZOLAM HCL 2 MG/2ML IJ SOLN
INTRAMUSCULAR | Status: AC
  Filled 2016-12-08: qty 2

## 2016-12-08 MED ORDER — FUROSEMIDE 10 MG/ML IJ SOLN
INTRAMUSCULAR | Status: AC
  Administered 2016-12-08: 10 mg via INTRAVENOUS
  Filled 2016-12-08: qty 2

## 2016-12-08 MED ORDER — PROMETHAZINE HCL 25 MG/ML IJ SOLN
INTRAMUSCULAR | Status: AC
  Filled 2016-12-08: qty 1

## 2016-12-08 MED ORDER — MORPHINE SULFATE (PF) 10 MG/ML IV SOLN
INTRAVENOUS | Status: AC
  Filled 2016-12-08: qty 1

## 2016-12-08 MED ORDER — LEVOFLOXACIN 500 MG PO TABS
500.0000 mg | ORAL_TABLET | Freq: Once | ORAL | Status: AC
  Administered 2016-12-08: 500 mg via ORAL

## 2016-12-08 MED ORDER — CIPROFLOXACIN HCL 500 MG PO TABS: 500.0000 mg | ORAL_TABLET | Freq: Two times a day (BID) | ORAL | 0 refills | Status: DC

## 2016-12-08 MED ORDER — DIPHENHYDRAMINE HCL 25 MG PO CAPS
ORAL_CAPSULE | ORAL | Status: AC
  Filled 2016-12-08: qty 1

## 2016-12-08 MED ORDER — ONDANSETRON 8 MG PO TBDP: 8.0000 mg | ORAL_TABLET | Freq: Four times a day (QID) | ORAL | 3 refills | Status: DC | PRN

## 2016-12-08 MED ORDER — MIDAZOLAM HCL 2 MG/2ML IJ SOLN
1.0000 mg | Freq: Once | INTRAMUSCULAR | Status: AC
  Administered 2016-12-08: 1 mg via INTRAMUSCULAR

## 2016-12-08 MED ORDER — PROMETHAZINE HCL 25 MG/ML IJ SOLN
25.0000 mg | Freq: Once | INTRAMUSCULAR | Status: AC
  Administered 2016-12-08: 25 mg via INTRAMUSCULAR
  Filled 2016-12-08: qty 1

## 2016-12-08 MED ORDER — LEVOFLOXACIN 500 MG PO TABS
ORAL_TABLET | ORAL | Status: AC
  Filled 2016-12-08: qty 1

## 2016-12-08 MED ORDER — DIPHENHYDRAMINE HCL 25 MG PO CAPS
25.0000 mg | ORAL_CAPSULE | Freq: Once | ORAL | Status: AC
  Administered 2016-12-08: 25 mg via ORAL

## 2016-12-08 MED ORDER — TAMSULOSIN HCL 0.4 MG PO CAPS: 0.4000 mg | ORAL_CAPSULE | Freq: Every day | ORAL | 1 refills | Status: DC

## 2016-12-08 MED ORDER — MORPHINE SULFATE (PF) 10 MG/ML IV SOLN
10.0000 mg | Freq: Once | INTRAVENOUS | Status: AC
  Administered 2016-12-08: 10 mg via INTRAMUSCULAR

## 2016-12-08 MED ORDER — DEXTROSE-NACL 5-0.45 % IV SOLN
INTRAVENOUS | Status: DC
  Administered 2016-12-08: 07:00:00 via INTRAVENOUS

## 2016-12-08 NOTE — Discharge Instructions (Signed)
Renal Colic Renal colic is pain that is caused by passing a kidney stone. The pain can be sharp and severe. It may be felt in the back, abdomen, side (flank), or groin. It can cause nausea. Renal colic can come and go. Follow these instructions at home: Watch your condition for any changes. The following actions may help to lessen any discomfort that you are feeling:  Take medicines only as directed by your health care provider.  Ask your health care provider if it is okay to take over-the-counter pain medicine.  Drink enough fluid to keep your urine clear or pale yellow. Drink 6-8 glasses of water each day.  Limit the amount of salt that you eat to less than 2 grams per day.  Reduce the amount of protein in your diet. Eat less meat, fish, nuts, and dairy.  Avoid foods such as spinach, rhubarb, nuts, or bran. These may make kidney stones more likely to form. Contact a health care provider if:  You have a fever or chills.  Your urine smells bad or looks cloudy.  You have pain or burning when you pass urine. Get help right away if:  Your flank pain or groin pain suddenly worsens.  You become confused or disoriented or you lose consciousness. This information is not intended to replace advice given to you by your health care provider. Make sure you discuss any questions you have with your health care provider. Document Released: 05/18/2005 Document Revised: 01/12/2016 Document Reviewed: 06/18/2014 Elsevier Interactive Patient Education  2017 [place]. Kidney Stones Kidney stones (urolithiasis) are rock-like masses that form inside of the kidneys. Kidneys are organs that make pee (urine). A kidney stone can cause very bad pain and can block the flow of pee. The stone usually leaves your body (passes) through your pee. You may need to have a doctor take out the stone. Follow these instructions at home: Eating and drinking   Drink enough fluid to keep your pee clear or pale yellow.  This will help you pass the stone.  If told by your doctor, change the foods you eat (your diet). This may include:  Limiting how much salt (sodium) you eat.  Eating more fruits and vegetables.  Limiting how much meat, poultry, fish, and eggs you eat.  Follow instructions from your doctor about eating or drinking restrictions. General instructions   Collect pee samples as told by your doctor. You may need to collect a pee sample:  24 hours after a stone comes out.  8-12 weeks after a stone comes out, and every 6-12 months after that.  Strain your pee every time you pee (urinate), for as long as told. Use the strainer that your doctor recommends.  Do not throw out the stone. Keep it so that it can be tested by your doctor.  Take over-the-counter and prescription medicines only as told by your doctor.  Keep all follow-up visits as told by your doctor. This is important. You may need follow-up tests. Preventing kidney stones  To prevent another kidney stone:  Drink enough fluid to keep your pee clear or pale yellow. This is the best way to prevent kidney stones.  Eat healthy foods.  Avoid certain foods as told by your doctor. You may be told to eat less protein.  Stay at a healthy weight. Contact a doctor if:  You have pain that gets worse or does not get better with medicine. Get help right away if:  You have a fever or chills.  You get very bad pain.  You get new pain in your belly (abdomen).  You pass out (faint).  You cannot pee. This information is not intended to replace advice given to you by your health care provider. Make sure you discuss any questions you have with your health care provider. Document Released: 01/25/2008 Document Revised: 04/26/2016 Document Reviewed: 04/26/2016 Elsevier Interactive Patient Education  2017 [place] After This sheet gives you information about how to care for yourself after your procedure. Your  health care provider may also give you more specific instructions. If you have problems or questions, contact your health care provider. What can I expect after the procedure? After the procedure, it is common to have:  Some blood in your urine. This should only last for a few days.  Soreness in your back, sides, or upper abdomen for a few days.  Blotches or bruises on your back where the pressure wave entered the skin.  Pain, discomfort, or nausea when pieces (fragments) of the kidney stone move through the tube that carries urine from the kidney to the bladder (ureter). Stone fragments may pass soon after the procedure, but they may continue to pass for up to 4-8 weeks.  If you have severe pain or nausea, contact your health care provider. This may be caused by a large stone that was not broken up, and this may mean that you need more treatment.  Some pain or discomfort during urination.  Some pain or discomfort in the lower abdomen or (in men) at the base of the penis. Follow these instructions at home: Medicines   Take over-the-counter and prescription medicines only as told by your health care provider.  If you were prescribed an antibiotic medicine, take it as told by your health care provider. Do not stop taking the antibiotic even if you start to feel better.  Do not drive for 24 hours if you were given a medicine to help you relax (sedative).  Do not drive or use heavy machinery while taking prescription pain medicine. Eating and drinking   Drink enough water and fluids to keep your urine clear or pale yellow. This helps any remaining pieces of the stone to pass. It can also help prevent new stones from forming.  Eat plenty of fresh fruits and vegetables.  Follow instructions from your health care provider about eating and drinking restrictions. You may be instructed:  To reduce how much salt (sodium) you eat or drink. Check ingredients and nutrition facts on packaged  foods and beverages.  To reduce how much meat you eat.  Eat the recommended amount of calcium for your age and gender. Ask your health care provider how much calcium you should have. General instructions   Get plenty of rest.  Most people can resume normal activities 1-2 days after the procedure. Ask your health care provider what activities are safe for you.  If directed, strain all urine through the strainer that was provided by your health care provider.  Keep all fragments for your health care provider to see. Any stones that are found may be sent to a medical lab for examination. The stone may be as small as a grain of salt.  Keep all follow-up visits as told by your health care provider. This is important. Contact a health care provider if:  You have pain that is severe or does not get better with medicine.  You have nausea that is severe or does not go away.  You  have blood in your urine longer than your health care provider told you to expect.  You have more blood in your urine.  You have pain during urination that does not go away.  You urinate more frequently than usual and this does not go away.  You develop a rash or any other possible signs of an allergic reaction. Get help right away if:  You have severe pain in your back, sides, or upper abdomen.  You have severe pain while urinating.  Your urine is very dark red.  You have blood in your stool (feces).  You cannot pass any urine at all.  You feel a strong urge to urinate after emptying your bladder.  You have a fever or chills.  You develop shortness of breath, difficulty breathing, or chest pain.  You have severe nausea that leads to persistent vomiting.  You faint. Summary  After this procedure, it is common to have some pain, discomfort, or nausea when pieces (fragments) of the kidney stone move through the tube that carries urine from the kidney to the bladder (ureter). If this pain or nausea  is severe, however, you should contact your health care provider.  Most people can resume normal activities 1-2 days after the procedure. Ask your health care provider what activities are safe for you.  Drink enough water and fluids to keep your urine clear or pale yellow. This helps any remaining pieces of the stone to pass, and it can help prevent new stones from forming.  If directed, strain your urine and keep all fragments for your health care provider to see. Fragments or stones may be as small as a grain of salt.  Get help right away if you have severe pain in your back, sides, or upper abdomen or have severe pain while urinating. This information is not intended to replace advice given to you by your health care provider. Make sure you discuss any questions you have with your health care provider. Document Released: 08/28/2007 Document Revised: 06/29/2016 Document Reviewed: 06/29/2016 Elsevier Interactive Patient Education  2017 [employer].

## 2016-12-09 ENCOUNTER — Encounter: Payer: Self-pay | Admitting: Urology

## 2017-11-10 ENCOUNTER — Encounter: Payer: Self-pay | Admitting: *Deleted

## 2017-11-10 ENCOUNTER — Other Ambulatory Visit: Payer: Self-pay | Admitting: Family Medicine

## 2018-02-23 ENCOUNTER — Other Ambulatory Visit: Payer: Self-pay | Admitting: Family Medicine

## 2018-03-20 ENCOUNTER — Ambulatory Visit: Payer: Self-pay | Admitting: Family Medicine

## 2018-03-20 ENCOUNTER — Encounter: Payer: Self-pay | Admitting: Family Medicine

## 2018-03-20 VITALS — BP 124/76 | HR 62 | Temp 98.0°F | Ht 69.0 in | Wt 172.8 lb

## 2018-03-20 DIAGNOSIS — M25569 Pain in unspecified knee: Secondary | ICD-10-CM

## 2018-03-20 DIAGNOSIS — Z0000 Encounter for general adult medical examination without abnormal findings: Secondary | ICD-10-CM

## 2018-03-20 DIAGNOSIS — Z7189 Other specified counseling: Secondary | ICD-10-CM

## 2018-03-20 DIAGNOSIS — E785 Hyperlipidemia, unspecified: Secondary | ICD-10-CM

## 2018-03-20 DIAGNOSIS — Z1211 Encounter for screening for malignant neoplasm of colon: Secondary | ICD-10-CM

## 2018-03-20 DIAGNOSIS — N529 Male erectile dysfunction, unspecified: Secondary | ICD-10-CM

## 2018-03-20 LAB — LIPID PANEL
CHOLESTEROL: 141 mg/dL (ref 0–200)
HDL: 39.9 mg/dL (ref >39.00)
LDL Cholesterol: 77 mg/dL (ref 0–99)
NONHDL: 101.03
Total CHOL/HDL Ratio: 4
Triglycerides: 121 mg/dL (ref 0.0–149.0)
VLDL: 24.2 mg/dL (ref 0.0–40.0)

## 2018-03-20 LAB — GLUCOSE, RANDOM: Glucose, Bld: 105 mg/dL — ABNORMAL HIGH (ref 70–99)

## 2018-03-20 MED ORDER — SILDENAFIL CITRATE 20 MG PO TABS: 60.0000 mg | ORAL_TABLET | Freq: Every day | ORAL | 12 refills | Status: DC | PRN

## 2018-03-20 MED ORDER — PRAVASTATIN SODIUM 40 MG PO TABS: 40.0000 mg | ORAL_TABLET | Freq: Every day | ORAL | 3 refills | Status: DC

## 2018-03-20 NOTE — Patient Instructions (Addendum)
Check with the pharmacy or the health department about a tetanus shot.   Go to the lab on the way out.  We'll contact you with your lab report. Try the knee sleeve on your right knee.  If the knee pain is worse, then it is reasonable to see ortho.  Update me as needed.   Take care.  Glad to see you.

## 2018-03-20 NOTE — Progress Notes (Signed)
Tetanus 2009, d/w pt.  See avs.   Shingles d/w pt.  Out of stock.   PNA not due.  Flu shot due in the fall.   Prostate cancer screening and PSA options (with potential risks and benefits of testing vs not testing) were discussed along with recent recs/guidelines.  He declined testing PSA at this point. D/w patient [id number] for colon cancer screening, including IFOB vs. colonoscopy.  Risks and benefits of both were discussed and patient voiced understanding.  Pt elects for: IFOB.   Living will d/w pt. Wife designated if patient were incapacitated.  Diet and exercise d/w pt. Doing as well as he can, d/w pt.   Labs pending.  HIV and HCV neg prev.  D/w pt.   BMs are regular usually, d/w pt.  This may be diet dependent.    Elevated Cholesterol: Using medications without problems: yes Muscle aches: no Diet compliance: encouraged.   Exercise: encouraged.   Labs pending.   R knee paresthesia laterally.  Medial pressure on the R knee causes lateral knee pain with a shock sensation down the R upper lateral shin.  He didn't want to intervene unless sig worse.  D/w pt.  He'll update me as needed.    His R 3rd trigger finger improved with injection.  I'll defer, he was happy with current functional status.  ED.  Possibly some relief from med.  No ADE on med except for possible HA after use.  Reasonable to continue PRN use.    PMH and SH reviewed  Meds, vitals, and allergies reviewed.   ROS: Per HPI.  Unless specifically indicated otherwise in HPI, the patient denies:  General: fever. Eyes: acute vision changes ENT: sore throat Cardiovascular: chest pain Respiratory: SOB GI: vomiting GU: dysuria Musculoskeletal: acute back pain Derm: acute rash Neuro: acute motor dysfunction Psych: worsening mood Endocrine: polydipsia Heme: bleeding Allergy: hayfever  GEN: nad, alert and oriented HEENT: mucous membranes moist NECK: supple w/o LA CV: rrr. PULM: ctab, no inc wob ABD: soft,  +bs EXT: no edema SKIN: no acute rash Mild B knee crepitus on ROM with R knee lateral paresthesia noted.

## 2018-03-21 DIAGNOSIS — M25569 Pain in unspecified knee: Secondary | ICD-10-CM | POA: Insufficient documentation

## 2018-03-21 DIAGNOSIS — Z0000 Encounter for general adult medical examination without abnormal findings: Secondary | ICD-10-CM | POA: Insufficient documentation

## 2018-03-21 NOTE — Assessment & Plan Note (Signed)
Tetanus 2009, d/w pt.  See avs.   Shingles d/w pt.  Out of stock.   PNA not due.  Flu shot due in the fall.   Prostate cancer screening and PSA options (with potential risks and benefits of testing vs not testing) were discussed along with recent recs/guidelines.  He declined testing PSA at this point. D/w patient [id number] for colon cancer screening, including IFOB vs. colonoscopy.  Risks and benefits of both were discussed and patient voiced understanding.  Pt elects for: IFOB.   Living will d/w pt. Wife designated if patient were incapacitated.  Diet and exercise d/w pt. Doing as well as he can, d/w pt.   Labs pending.  HIV and HCV neg prev.  D/w pt.

## 2018-03-21 NOTE — Assessment & Plan Note (Signed)
Mild B knee crepitus on ROM with R knee lateral paresthesia noted.  I presume that he does have some mechanical symptoms with local compression of a peripheral nerve causing the paresthesia noted on the lateral aspect of the right knee.  I talked with him about options.  The pain was not significant enough for him to want intervention at this point.  He has no weakness.  He will update me as needed.  This does not appear to be emergent or ominous and it does not appear to be originating in his back.

## 2018-03-21 NOTE — Assessment & Plan Note (Signed)
Living will d/w pt.  Wife designated if patient were incapacitated.   ?

## 2018-03-21 NOTE — Assessment & Plan Note (Signed)
Possibly some relief from med.  No ADE on med except for possible HA after use.  Reasonable to continue PRN use.

## 2018-03-21 NOTE — Assessment & Plan Note (Signed)
Continue statin.  Recheck labs today.  Continue work on diet and exercise.  Recheck sugar today.

## 2018-03-30 ENCOUNTER — Ambulatory Visit: Payer: Self-pay | Admitting: Primary Care

## 2018-03-30 ENCOUNTER — Ambulatory Visit (INDEPENDENT_AMBULATORY_CARE_PROVIDER_SITE_OTHER)
Admission: RE | Admit: 2018-03-30 | Discharge: 2018-03-30 | Disposition: A | Discharge status: Home / Self Care | Payer: Self-pay | Source: Ambulatory Visit | Attending: Primary Care | Admitting: Primary Care

## 2018-03-30 ENCOUNTER — Encounter: Payer: Self-pay | Admitting: Primary Care

## 2018-03-30 ENCOUNTER — Ambulatory Visit: Payer: Self-pay | Admitting: *Deleted

## 2018-03-30 VITALS — BP 150/92 | HR 70 | Temp 98.2°F

## 2018-03-30 DIAGNOSIS — M5441 Lumbago with sciatica, right side: Secondary | ICD-10-CM

## 2018-03-30 MED ORDER — TIZANIDINE HCL 4 MG PO TABS: 4.0000 mg | ORAL_TABLET | Freq: Three times a day (TID) | ORAL | 0 refills | Status: DC | PRN

## 2018-03-30 MED ORDER — METHYLPREDNISOLONE ACETATE 40 MG/ML IJ SUSP
80.0000 mg | Freq: Once | INTRAMUSCULAR | Status: AC
  Administered 2018-03-30: 80 mg via INTRAMUSCULAR

## 2018-03-30 MED ORDER — METHYLPREDNISOLONE ACETATE 40 MG/ML IJ SUSP: 40.0000 mg | Freq: Once | INTRAMUSCULAR | Status: DC

## 2018-03-30 MED ORDER — NAPROXEN 500 MG PO TABS: 500.0000 mg | ORAL_TABLET | Freq: Two times a day (BID) | ORAL | 0 refills | Status: DC

## 2018-03-30 NOTE — Patient Instructions (Signed)
Complete xray(s) prior to leaving today. I will notify you of your results once received.  Start naproxen 500 mg tablets tomorrow for back pain and inflammation. Take 1 tablet by mouth twice daily with food for 7 days. Avoid Advil/Motrin with this medication.   You may take Tizanidine muscle relaxer every 8 hours as needed for spasms. Start with a bedtime only dose first as this will cause drowsiness.   Work on stretching and remain active as tolerated to avoid further tightness/stiffness.   You can apply ice/heat to the back as needed.  It was a pleasure meeting you!

## 2018-03-30 NOTE — Addendum Note (Signed)
Addended by: Jacqualin Combes on: 03/30/2018 04:05 PM   Modules accepted: Orders
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
## 2018-03-30 NOTE — Telephone Encounter (Signed)
Evaluated and treated.

## 2018-03-30 NOTE — Telephone Encounter (Signed)
Pt has appt with Gentry Fitz NP today at 3:00 pm.

## 2018-03-30 NOTE — Addendum Note (Signed)
Addended by: Jacqualin Combes on: 03/30/2018 04:25 PM   Modules accepted: Orders

## 2018-04-09 ENCOUNTER — Other Ambulatory Visit (INDEPENDENT_AMBULATORY_CARE_PROVIDER_SITE_OTHER): Payer: Self-pay

## 2018-04-09 DIAGNOSIS — Z1211 Encounter for screening for malignant neoplasm of colon: Secondary | ICD-10-CM

## 2018-04-09 LAB — FECAL OCCULT BLOOD, IMMUNOCHEMICAL: FECAL OCCULT BLD: NEGATIVE

## 2019-03-26 ENCOUNTER — Other Ambulatory Visit: Payer: Self-pay | Admitting: Family Medicine

## 2019-03-27 NOTE — Telephone Encounter (Signed)
LOV 04/09/2018. No future appointments. Need CPE or follow up?

## 2019-03-27 NOTE — Telephone Encounter (Signed)
CPE when possible.  Prescription sent.  Thanks.

## 2019-03-28 NOTE — Telephone Encounter (Signed)
Patient advised. Patient will look at his schedule and call us back.

## 2019-06-05 ENCOUNTER — Emergency Department (HOSPITAL_COMMUNITY): Payer: Self-pay

## 2019-06-05 ENCOUNTER — Emergency Department (HOSPITAL_COMMUNITY)
Admission: EM | Admit: 2019-06-05 | Discharge: 2019-06-05 | Disposition: A | Discharge status: Home / Self Care | Payer: Self-pay | Attending: Emergency Medicine | Admitting: Emergency Medicine

## 2019-06-05 ENCOUNTER — Other Ambulatory Visit: Payer: Self-pay

## 2019-06-05 DIAGNOSIS — S52121A Displaced fracture of head of right radius, initial encounter for closed fracture: Secondary | ICD-10-CM

## 2019-06-05 DIAGNOSIS — Y9289 Other specified places as the place of occurrence of the external cause: Secondary | ICD-10-CM | POA: Insufficient documentation

## 2019-06-05 DIAGNOSIS — S53094A Other dislocation of right radial head, initial encounter: Secondary | ICD-10-CM | POA: Insufficient documentation

## 2019-06-05 DIAGNOSIS — W01198A Fall on same level from slipping, tripping and stumbling with subsequent striking against other object, initial encounter: Secondary | ICD-10-CM | POA: Insufficient documentation

## 2019-06-05 DIAGNOSIS — S53104A Unspecified dislocation of right ulnohumeral joint, initial encounter: Secondary | ICD-10-CM

## 2019-06-05 DIAGNOSIS — Z79899 Other long term (current) drug therapy: Secondary | ICD-10-CM | POA: Insufficient documentation

## 2019-06-05 DIAGNOSIS — W19XXXA Unspecified fall, initial encounter: Secondary | ICD-10-CM

## 2019-06-05 DIAGNOSIS — Y999 Unspecified external cause status: Secondary | ICD-10-CM | POA: Insufficient documentation

## 2019-06-05 DIAGNOSIS — Y9389 Activity, other specified: Secondary | ICD-10-CM | POA: Insufficient documentation

## 2019-06-05 MED ORDER — PROPOFOL 10 MG/ML IV BOLUS
1.0000 mg/kg | Freq: Once | INTRAVENOUS | Status: AC
  Administered 2019-06-05: 78.5 mg via INTRAVENOUS
  Filled 2019-06-05: qty 20

## 2019-06-05 MED ORDER — ONDANSETRON 4 MG PO TBDP: 4.0000 mg | ORAL_TABLET | Freq: Three times a day (TID) | ORAL | 0 refills | Status: DC | PRN

## 2019-06-05 MED ORDER — OXYCODONE-ACETAMINOPHEN 5-325 MG PO TABS: 1.0000 | ORAL_TABLET | Freq: Four times a day (QID) | ORAL | 0 refills | Status: DC | PRN

## 2019-06-05 MED ORDER — ONDANSETRON HCL 4 MG/2ML IJ SOLN
4.0000 mg | Freq: Once | INTRAMUSCULAR | Status: AC
  Administered 2019-06-05: 4 mg via INTRAVENOUS
  Filled 2019-06-05: qty 2

## 2019-06-05 MED ORDER — HYDROMORPHONE HCL 1 MG/ML IJ SOLN
1.0000 mg | Freq: Once | INTRAMUSCULAR | Status: AC
  Administered 2019-06-05: 1 mg via INTRAVENOUS
  Filled 2019-06-05: qty 1

## 2019-06-05 MED ORDER — MORPHINE SULFATE (PF) 4 MG/ML IV SOLN
4.0000 mg | Freq: Once | INTRAVENOUS | Status: AC
  Administered 2019-06-05: 4 mg via INTRAVENOUS
  Filled 2019-06-05: qty 1

## 2019-06-05 MED ORDER — PROPOFOL 10 MG/ML IV BOLUS
INTRAVENOUS | Status: AC | PRN
  Administered 2019-06-05: 80 mg via INTRAVENOUS
  Administered 2019-06-05: 30 mg via INTRAVENOUS

## 2019-06-05 MED ORDER — HYDROMORPHONE HCL 1 MG/ML IJ SOLN
0.5000 mg | Freq: Once | INTRAMUSCULAR | Status: AC
  Administered 2019-06-05: 0.5 mg via INTRAVENOUS
  Filled 2019-06-05: qty 1

## 2019-06-05 MED ORDER — SODIUM CHLORIDE 0.9 % IV BOLUS
1000.0000 mL | Freq: Once | INTRAVENOUS | Status: AC
  Administered 2019-06-05: 1000 mL via INTRAVENOUS

## 2019-06-05 NOTE — Discharge Instructions (Signed)
You can take 1000 mg of Tylenol.  Do not exceed 4000 mg of Tylenol a day.  Take pain medications as directed for break through pain. Do not drive or operate machinery while taking this medication.  This medication does have Tylenol in it as well so be aware of that.  As we discussed, it is very important that you do not rotate or move the elbow else as this could cause instability.  Do not get the splint wet.  Use the sling for support and stabilization.  As we discussed, you will need to follow-up with referred orthopedic doctor.  Call their office and arrange for an appointment.  Return the emergency department for any worsening pain, discoloration of your fingers, numbness/weakness or any other worsening concerning symptoms.

## 2019-06-05 NOTE — ED Provider Notes (Signed)
Medical screening examination/treatment/procedure(s) were conducted as a shared visit with non-physician practitioner(s) and myself.  I personally evaluated the patient during the encounter. Briefly, the patient is a 63 y.o. male who presents the ED with right arm injury.  Patient tripped and fell and injured his right elbow.  Appears to have fracture dislocation of the right elbow.  Has obvious deformity on exam.  However appears neurovascularly and neuromuscularly intact at this time.  Will attempt reduction with propofol sedation.  Interval reduction of elbow fracture dislocation on repeat x-ray.  Patient tolerated propofol sedation well.  Will get CT scan for surgical planning and discharged patient.  We will follow-up with orthopedics.  This chart was dictated using voice recognition software.  Despite best efforts to proofread,  errors can occur which can change the documentation meaning.     EKG Interpretation None        .Sedation  Date/Time: 06/05/2019 11:24 AM Performed by: Lennice Sites, DO Authorized by: Lennice Sites, DO   Consent:    Consent obtained:  Verbal   Consent given by:  Patient   Risks discussed:  Allergic reaction, dysrhythmia, inadequate sedation, nausea, prolonged hypoxia resulting in organ damage, prolonged sedation necessitating reversal, respiratory compromise necessitating ventilatory assistance and intubation and vomiting   Alternatives discussed:  Analgesia without sedation, anxiolysis and regional anesthesia Universal protocol:    Procedure explained and questions answered to patient or proxy's satisfaction: yes     Relevant documents present and verified: yes     Test results available and properly labeled: yes     Imaging studies available: yes     Required blood products, implants, devices, and special equipment available: yes     Site/side marked: yes     Immediately prior to procedure a time out was called: yes     Patient identity  confirmation method:  Verbally with patient and arm band Indications:    Procedure performed:  Fracture reduction   Procedure necessitating sedation performed by:  Physician performing sedation Pre-sedation assessment:    Time since last food or drink:  5   ASA classification: class 1 - normal, healthy patient     Neck mobility: normal     Mouth opening:  3 or more finger widths   Thyromental distance:  4 finger widths   Mallampati score:  I - soft palate, uvula, fauces, pillars visible   Pre-sedation assessments completed and reviewed: airway patency, cardiovascular function, hydration status, mental status, nausea/vomiting, pain level, respiratory function and temperature     Pre-sedation assessment completed:  06/05/2019 11:24 AM Immediate pre-procedure details:    Reassessment: Patient reassessed immediately prior to procedure     Reviewed: vital signs, relevant labs/tests and NPO status     Verified: bag valve mask available, emergency equipment available, intubation equipment available, IV patency confirmed, oxygen available and suction available   Procedure details (see MAR for exact dosages):    Preoxygenation:  Nasal cannula   Sedation:  Propofol   Intended level of sedation: deep   Intra-procedure monitoring:  Blood pressure monitoring, cardiac monitor, continuous pulse oximetry, frequent LOC assessments, frequent vital sign checks and continuous capnometry   Intra-procedure events: none     Total Provider sedation time (minutes):  15 Post-procedure details:    Post-sedation assessment completed:  06/05/2019 2:04 PM   Attendance: Constant attendance by certified staff until patient recovered     Recovery: Patient returned to pre-procedure baseline     Post-sedation assessments completed and reviewed: airway patency,  cardiovascular function, hydration status, mental status, nausea/vomiting, pain level, respiratory function and temperature     Patient is stable for discharge or  admission: yes     Patient tolerance:  Tolerated well, no immediate complications      Lennice Sites, DO 06/05/19 1404

## 2019-06-05 NOTE — ED Triage Notes (Signed)
Pt BIBA from home.   Per EMS- Pt reports tripping and falling on right side- elbow/arm.  Landed on pavement. Denies taking blood thinners. Pt AOx4, radial pulse in take, able to move fingers. Pt arrived with arm wrapped and in SAM splint brace.   200 mcg fentanyl given by EMS- last given 0943.  20 g L hand-

## 2019-06-05 NOTE — ED Provider Notes (Signed)
[facility] DEPT Provider Note   CSN: [id number] Arrival date & time: 06/05/19  0941     History   Chief Complaint Chief Complaint  Patient presents with  . Arm Injury    right    HPI [name] [name] is a 63 y.o. male brought in by EMS for evaluation of right elbow pain.  He reports that about 8:45 AM, he was walking and tripped over a cable, landing on his right elbow and arm.  He does not think he hit his shoulder or his wrist.  Denies any head injury or LOC.  He is not on blood thinners.  Patient had a brace applied on EMS arrival with notable deformity noted.  He was given 200 mcg of fentanyl which he states improved his pain.  Patient states that he did not have any preceding chest pain or dizziness that caused him to fall.  He denies any numbness/weakness.  Patient denies any vision changes, abdominal pain, nausea/vomiting, hip pain, leg pain, neck pain.     The history is provided by the patient.    Past Medical History:  Diagnosis Date  . Hyperlipidemia   . Impotence of organic origin   . Nephrolithiasis   . Nonspecific abnormal results of liver function study   . Palpitations    secondary to PVCs  . Trigger finger (acquired)    Right, middle    Patient Active Problem List   Diagnosis Date Noted  . Healthcare maintenance 03/21/2018  . Knee pain 03/21/2018  . Advance care planning 08/13/2014  . Radicular pain in left arm 04/03/2014  . Situational anxiety 11/06/2011  . TRIGGER FINGER, RIGHT MIDDLE 07/26/2010  . ORGANIC IMPOTENCE 03/15/2010  . PVC (premature ventricular contraction) 08/21/2007  . Hyperlipidemia 02/21/2007  . CALCULUS, KIDNEY 02/21/2007  . PALPITATIONS 01/13/2007    Past Surgical History:  Procedure Laterality Date  . CT ABD W & PELVIS WO CM  12/21/2006   2MM Stone at [employer], [employer]  . EXTRACORPOREAL SHOCK WAVE LITHOTRIPSY Left 12/08/2016   Procedure: EXTRACORPOREAL SHOCK WAVE LITHOTRIPSY (ESWL);  Surgeon:  Royston Cowper, MD;  Location: [facility] ORS;  Service: Urology;  Laterality: Left;  . LAMINECTOMY  08/1989   L5  . LAMINECTOMY  08/2004   S1        Home Medications    Prior to Admission medications   Medication Sig Start Date End Date Taking? Authorizing Provider  Glucosamine-Chondroit-Vit C-Mn (GLUCOSAMINE 1500 COMPLEX) CAPS Take 1 capsule by mouth daily.     Yes [provider]  Omega-3 Fatty Acids (FISH OIL) 1000 MG CAPS Take 1 capsule by mouth 2 (two) times daily.    Yes [provider]  pravastatin (PRAVACHOL) 40 MG tablet TAKE ONE TABLET BY MOUTH AT BEDTIME Patient taking differently: Take 40 mg by mouth at bedtime.  03/27/19  Yes Tonia Ghent, MD  naproxen (NAPROSYN) 500 MG tablet Take 1 tablet (500 mg total) by mouth 2 (two) times daily with a meal. Patient not taking: Reported on 06/05/2019 03/30/18   Pleas Koch, NP  ondansetron (ZOFRAN ODT) 4 MG disintegrating tablet Take 1 tablet (4 mg total) by mouth every 8 (eight) hours as needed for nausea or vomiting. 06/05/19   Volanda Napoleon, PA-C  oxyCODONE-acetaminophen (PERCOCET/ROXICET) 5-325 MG tablet Take 1-2 tablets by mouth every 6 (six) hours as needed for severe pain. 06/05/19   Volanda Napoleon, PA-C  sildenafil (REVATIO) 20 MG tablet Take 3-5 tablets (60-100 mg  total) by mouth daily as needed. Patient not taking: Reported on 06/05/2019 03/20/18   Tonia Ghent, MD  tiZANidine (ZANAFLEX) 4 MG tablet Take 1 tablet (4 mg total) by mouth every 8 (eight) hours as needed for muscle spasms. Patient not taking: Reported on 06/05/2019 03/30/18   Pleas Koch, NP    Family History Family History  Problem Relation Age of Onset  . Diabetes Mother   . Hypertension Mother   . Heart disease Neg Hx   . Cancer Neg Hx   . Prostate cancer Neg Hx   . Colon cancer Neg Hx     Social History Social History   Tobacco Use  . Smoking status: Never Smoker  . Smokeless tobacco: Never Used  Substance Use  Topics  . Alcohol use: No  . Drug use: No     Allergies   Patient has no known allergies.   Review of Systems Review of Systems  Constitutional: Negative for fever.  Respiratory: Negative for cough and shortness of breath.   Cardiovascular: Negative for chest pain.  Gastrointestinal: Negative for abdominal pain, nausea and vomiting.  Genitourinary: Negative for dysuria and hematuria.  Musculoskeletal: Negative for neck pain.       Right elbow pain  Neurological: Negative for weakness, numbness and headaches.  All other systems reviewed and are negative.    Physical Exam Updated Vital Signs BP (!) 148/76   Pulse 64   Temp 97.6 F (36.4 C) (Oral)   Resp 12   Ht 5\' 9"  (1.753 m)   Wt 78.5 kg   SpO2 100%   BMI 25.55 kg/m   Physical Exam Vitals signs and nursing note reviewed.  Constitutional:      Appearance: Normal appearance. He is well-developed.  HENT:     Head: Normocephalic and atraumatic.  Eyes:     General: Lids are normal.     Conjunctiva/sclera: Conjunctivae normal.     Pupils: Pupils are equal, round, and reactive to light.     Comments: PERRL. EOMs intact. No nystagmus. No neglect.   Neck:     Musculoskeletal: Full passive range of motion without pain.     Comments: Full flexion/extension and lateral movement of neck fully intact. No bony midline tenderness. No deformities or crepitus.  Cardiovascular:     Rate and Rhythm: Normal rate and regular rhythm.     Pulses: Normal pulses.          Radial pulses are 2+ on the right side and 2+ on the left side.     Heart sounds: Normal heart sounds. No murmur. No friction rub. No gallop.   Pulmonary:     Effort: Pulmonary effort is normal.     Breath sounds: Normal breath sounds.     Comments: Lungs clear to auscultation bilaterally.  Symmetric chest rise.  No wheezing, rales, rhonchi. Abdominal:     Palpations: Abdomen is soft. Abdomen is not rigid.     Tenderness: There is no abdominal tenderness. There  is no guarding.  Musculoskeletal: Normal range of motion.     Comments: Tenderness to palpation noted to right elbow with notable deformity.  Limited range of motion secondary to pain.  No bony tenderness noted to right shoulder, right forearm, right wrist.  He is able to move all 5 digits of right hand.  Skin:    General: Skin is warm and dry.     Capillary Refill: Capillary refill takes less than 2 seconds.  Comments: Good distal cap refill. RUE is not dusky in appearance or cool to touch.  Neurological:     Mental Status: He is alert and oriented to person, place, and time.     Comments: Sensation intact along major nerve distributions of BUE  Psychiatric:        Speech: Speech normal.      ED Treatments / Results  Labs (all labs ordered are listed, but only abnormal results are displayed) Labs Reviewed - No data to display  EKG None  Radiology Dg Elbow 2 Views Right  Result Date: 06/05/2019 CLINICAL DATA:  Postreduction EXAM: RIGHT ELBOW - 2 VIEW COMPARISON:  06/05/2019 FINDINGS: Single lateral view demonstrates reduction of the previously seen dislocated right elbow. Previously seen small fracture fragments are not visible with overlying splint/casting material. Fracture through the radial head again noted. IMPRESSION: Interval reduction. Radial head fracture again noted. Small fracture fragments are obscured by overlying casting material. Electronically Signed   By: Rolm Baptise M.D.   On: 06/05/2019 12:47   Dg Elbow 2 Views Right  Result Date: 06/05/2019 CLINICAL DATA:  Right elbow pain after trip and fall. Initial encounter. EXAM: RIGHT ELBOW - 2 VIEW COMPARISON:  None. FINDINGS: The elbow is laterally and posteriorly dislocated. There is a comminuted fracture of the radial head. Several tiny bone fragments project above the olecranon fossa. Marked soft tissue swelling is present. IMPRESSION: Lateral and posterior dislocation of the right elbow. Comminuted fracture of the  radial head. Electronically Signed   By: Inge Rise M.D.   On: 06/05/2019 10:55   Ct Elbow Right Wo Contrast  Result Date: 06/05/2019 CLINICAL DATA:  Fracture dislocation at the right elbow secondary to a fall. EXAM: CT OF THE LOWER RIGHT EXTREMITY WITHOUT CONTRAST TECHNIQUE: Multidetector CT imaging of the right lower extremity was performed according to the standard protocol. COMPARISON:  Radiographs dated 06/05/2019 FINDINGS: Bones/Joint/Cartilage The dislocation has been reduced. There is a comminuted displaced fracture of coronoid process of the proximal ulna. There is a slightly displaced fracture of the volar aspect of the radial head. There are small avulsions from the medial and lateral epicondyles of the distal humerus which may be fragments from the ulna or radius or could represent avulsions from the extensor and flexor tendon origins. Muscles and Tendons No discrete abnormalities. Soft tissues No discrete abnormalities. IMPRESSION: 1. Comminuted displaced fracture of the coronoid process of the proximal ulna. 2. Slightly displaced fracture of the volar aspect of the radial head. 3. Small avulsions from the medial and lateral epicondyles of the distal humerus which may be fragments from the ulna or radius or could represent avulsions from the extensor and flexor tendon origins. Electronically Signed   By: Lorriane Shire M.D.   On: 06/05/2019 14:43    Procedures Reduction of dislocation  Date/Time: 06/05/2019 2:54 PM Performed by: Volanda Napoleon, PA-C Authorized by: Volanda Napoleon, PA-C  Consent: Verbal consent obtained. Risks and benefits: risks, benefits and alternatives were discussed Consent given by: patient Patient understanding: patient states understanding of the procedure being performed Patient consent: the patient's understanding of the procedure matches consent given Procedure consent: procedure consent matches procedure scheduled Relevant documents: relevant  documents present and verified Test results: test results available and properly labeled Site marked: the operative site was marked Imaging studies: imaging studies available Required items: required blood products, implants, devices, and special equipment available Patient identity confirmed: verbally with patient Preparation: Patient was prepped and draped in the usual  sterile fashion. Local anesthesia used: no  Anesthesia: Local anesthesia used: no Patient tolerance: patient tolerated the procedure well with no immediate complications    (including critical care time)  Medications Ordered in ED Medications  propofol (DIPRIVAN) 10 mg/mL bolus/IV push 78.5 mg (has no administration in time range)  ondansetron (ZOFRAN) injection 4 mg (has no administration in time range)  morphine 4 MG/ML injection 4 mg (4 mg Intravenous Given 06/05/19 1005)  ondansetron (ZOFRAN) injection 4 mg (4 mg Intravenous Given 06/05/19 1005)  HYDROmorphone (DILAUDID) injection 0.5 mg (0.5 mg Intravenous Given 06/05/19 1054)  sodium chloride 0.9 % bolus 1,000 mL (1,000 mLs Intravenous New Bag/Given 06/05/19 1205)  ondansetron (ZOFRAN) injection 4 mg (4 mg Intravenous Given 06/05/19 1205)  propofol (DIPRIVAN) 10 mg/mL bolus/IV push (30 mg Intravenous Given 06/05/19 1204)  HYDROmorphone (DILAUDID) injection 1 mg (1 mg Intravenous Given 06/05/19 1447)     Initial Impression / Assessment and Plan / ED Course  I have reviewed the triage vital signs and the nursing notes.  Pertinent labs & imaging results that were available during my care of the patient were reviewed by me and considered in my medical decision making (see chart for details).        63 year old male who presents for evaluation of right elbow pain after mechanical fall.  No head injury, LOC.  Is on blood thinners.  Initially arrived, he is afebrile.  He appears uncomfortable but nontoxic.  He is slightly hypertensive.  Vitals otherwise stable.   Patient with notable deformity noted to right elbow.  Question dislocation versus fracture.  Trace neurovascular intact.  Plan for imaging.  X-ray of elbow shows a lateral posterior dislocation of the right elbow with a comminuted right fracture of the radial head.  Will consult Ortho.  Discussed patient with Dr. Lucia Gaskins (Ortho) who reviewed films.  He would like reduction here in the ED with plans for sugar tong splint.  He would like a CT of the elbow done before patient leaves.  He will have patient follow-up in the office for an outpatient follow-up appointment.   Sedation and reduction done as documented above.  Please see attending note for separate sedation note.  Reevaluation of distal pulses after reduction showed good radial pulse, good fill.  He was splinted in a sugar tong splint with a sling.  We will plan for postreduction films.  Postreduction films show interval reduction.  We will plan for CT.  Reevaluation.  Patient with good distal sensation and cap refill.  Vitals stable.  We will plan to send home a short course of pain medication given acute fracture.  Instructed patient to follow-up with Ortho as directed. At this time, patient exhibits no emergent life-threatening condition that require further evaluation in ED or admission. Patient had ample opportunity for questions and discussion. All patient's questions were answered with full understanding. Strict return precautions discussed. Patient expresses understanding and agreement to plan.   Portions of this note were generated with [employer]. Dictation errors may occur despite best attempts at proofreading.   Final Clinical Impressions(s) / ED Diagnoses   Final diagnoses:  Closed displaced fracture of head of right radius, initial encounter  Dislocation of right elbow, initial encounter    ED Discharge Orders         Ordered    oxyCODONE-acetaminophen (PERCOCET/ROXICET) 5-325 MG tablet  Every 6 hours PRN,    Status:  Discontinued     06/05/19 1434    oxyCODONE-acetaminophen (PERCOCET/ROXICET)  5-325 MG tablet  Every 6 hours PRN     06/05/19 1434    ondansetron (ZOFRAN ODT) 4 MG disintegrating tablet  Every 8 hours PRN     06/05/19 1450           Volanda Napoleon, PA-C 06/05/19 1456    Lennice Sites, DO 06/05/19 1503

## 2019-06-06 ENCOUNTER — Other Ambulatory Visit: Payer: Self-pay | Admitting: Orthopedic Surgery

## 2019-06-10 ENCOUNTER — Other Ambulatory Visit: Payer: Self-pay

## 2019-06-10 ENCOUNTER — Encounter (HOSPITAL_BASED_OUTPATIENT_CLINIC_OR_DEPARTMENT_OTHER): Payer: Self-pay | Admitting: *Deleted

## 2019-06-11 ENCOUNTER — Other Ambulatory Visit (HOSPITAL_COMMUNITY)
Admission: RE | Admit: 2019-06-11 | Discharge: 2019-06-11 | Disposition: A | Discharge status: Home / Self Care | Payer: Self-pay | Source: Ambulatory Visit | Attending: Orthopedic Surgery | Admitting: Orthopedic Surgery

## 2019-06-11 DIAGNOSIS — Z01812 Encounter for preprocedural laboratory examination: Secondary | ICD-10-CM | POA: Insufficient documentation

## 2019-06-11 DIAGNOSIS — Z20828 Contact with and (suspected) exposure to other viral communicable diseases: Secondary | ICD-10-CM | POA: Insufficient documentation

## 2019-06-11 NOTE — H&P (Signed)
[name] [name] is an 63 y.o. male.   CC / Reason for Visit: Right elbow problem HPI: This patient is a 63 year old LHD retired male who presents for evaluation of a right elbow injury that occurred when he tripped on a wire cable and fell onto an outstretched hand, landing on pavement.  He sustained a fracture dislocation of the right elbow that was grossly reduced in the ED and splinted and presents for further evaluation.  He reports that he is only taken Tylenol, as he sometimes gets nauseated with oxycodone.  Past Medical History:  Diagnosis Date  . Hyperlipidemia   . Impotence of organic origin   . Nephrolithiasis   . Nonspecific abnormal results of liver function study   . Palpitations    secondary to PVCs  . PONV (postoperative nausea and vomiting)   . Trigger finger (acquired)    Right, middle    Past Surgical History:  Procedure Laterality Date  . CT ABD W & PELVIS WO CM  12/21/2006   2MM Stone at [facility] [employer], [employer]  . EXTRACORPOREAL SHOCK WAVE LITHOTRIPSY Left 12/08/2016   Procedure: EXTRACORPOREAL SHOCK WAVE LITHOTRIPSY (ESWL);  Surgeon: Royston Cowper, MD;  Location: [facility] ORS;  Service: Urology;  Laterality: Left;  . LAMINECTOMY  08/1989   L5  . LAMINECTOMY  08/2004   S1    Family History  Problem Relation Age of Onset  . Diabetes Mother   . Hypertension Mother   . Heart disease Neg Hx   . Cancer Neg Hx   . Prostate cancer Neg Hx   . Colon cancer Neg Hx    Social History:  reports that he has never smoked. He has never used smokeless tobacco. He reports that he does not drink alcohol or use drugs.  Allergies: No Known Allergies  No medications prior to admission.    No results found for this or any previous visit (from the past 48 hour(s)). No results found.  Review of Systems  All other systems reviewed and are negative.   Height 5\' 9"  (1.753 m), weight 78.5 kg. Physical Exam  Constitutional:  WD, WN, NAD HEENT:  NCAT, EOMI Neuro/Psych:  Alert &  oriented to person, place, and time; appropriate mood & affect Lymphatic: No generalized UE edema or lymphadenopathy Extremities / MSK:  Both UE are normal with respect to appearance, ranges of motion, joint stability, muscle strength/tone, sensation, & perfusion except as otherwise noted:  The right upper extremity is splinted.  It extends fairly distally on the hand and this is rearranged today to allow for better digital motion.  Nonetheless, digital motion is nearly full.  Intact light touch sensibility in the radial, median, and ulnar nerve distributions with intact motor to the same  Labs / Xrays:  2 views of the right elbow ordered and obtained in the splint today reveals a maintained gross reduction of the elbow, bony details obscured by splint material  Assessment: Right elbow fracture dislocation, presently grossly reduced  Plan:  I discussed these findings with him in an operative plan for this Friday, which includes lateral exposure with repair versus replacement of the radial head, proceeding with lateral ligament repair before reassessing in determining any needs for medial repair.  I do not think that the coronoid fragments are large enough and of significance to be addressed independently.  We did review CT images and used a plastic model in demonstrating this.  I encouraged him in the meantime to continue work on digital motion and  after surgery we will use Tylenol and ibuprofen for pain and HO prevention.  The details of the operative procedure were discussed with the patient.  Questions were invited and answered.  In addition to the goal of the procedure, the risks of the procedure to include but not limited to bleeding; infection; damage to the nerves or blood vessels that could result in bleeding, numbness, weakness, chronic pain, and the need for additional procedures; stiffness; the need for revision surgery; and anesthetic risks were reviewed.  No specific outcome was guaranteed  or implied.  Informed consent was obtained.  Jolyn Nap, MD 06/11/2019, 3:16 PM

## 2019-06-11 NOTE — Progress Notes (Signed)

## 2019-06-12 LAB — NOVEL CORONAVIRUS, NAA (HOSP ORDER, SEND-OUT TO REF LAB; TAT 18-24 HRS): SARS-CoV-2, NAA: NOT DETECTED

## 2019-06-13 NOTE — Anesthesia Preprocedure Evaluation (Addendum)
Anesthesia Evaluation  Patient identified by MRN, date of birth, ID band Patient awake    Reviewed: Allergy & Precautions, NPO status , Patient's Chart, lab work & pertinent test results  History of Anesthesia Complications (+) PONV  Airway Mallampati: II  TM Distance: >3 FB Neck ROM: Full    Dental no notable dental hx. (+) Teeth Intact   Pulmonary    Pulmonary exam normal breath sounds clear to auscultation       Cardiovascular Exercise Tolerance: Good negative cardio ROS Normal cardiovascular exam Rhythm:Regular Rate:Normal     Neuro/Psych Anxiety negative neurological ROS     GI/Hepatic negative GI ROS, Neg liver ROS,   Endo/Other  negative endocrine ROS  Renal/GU Renal disease     Musculoskeletal negative musculoskeletal ROS (+)   Abdominal   Peds  Hematology negative hematology ROS (+)   Anesthesia Other Findings   Reproductive/Obstetrics negative OB ROS                            Anesthesia Physical Anesthesia Plan  ASA: II  Anesthesia Plan: General and Regional   Post-op Pain Management:    Induction: Intravenous  PONV Risk Score and Plan: 3 and Treatment may vary due to age or medical condition, Dexamethasone, Ondansetron and Midazolam  Airway Management Planned: LMA  Additional Equipment: None  Intra-op Plan:   Post-operative Plan:   Informed Consent: I have reviewed the patients History and Physical, chart, labs and discussed the procedure including the risks, benefits and alternatives for the proposed anesthesia with the patient or authorized representative who has indicated his/her understanding and acceptance.     Dental advisory given  Plan Discussed with: CRNA  Anesthesia Plan Comments: (GA w R ISB)       Anesthesia Quick Evaluation

## 2019-06-14 ENCOUNTER — Encounter (HOSPITAL_BASED_OUTPATIENT_CLINIC_OR_DEPARTMENT_OTHER): Payer: Self-pay | Admitting: Anesthesiology

## 2019-06-14 ENCOUNTER — Other Ambulatory Visit: Payer: Self-pay

## 2019-06-14 ENCOUNTER — Encounter (HOSPITAL_BASED_OUTPATIENT_CLINIC_OR_DEPARTMENT_OTHER)
Admission: RE | Disposition: A | Discharge status: Home / Self Care | Payer: Self-pay | Source: Home / Self Care | Attending: Orthopedic Surgery

## 2019-06-14 ENCOUNTER — Ambulatory Visit (HOSPITAL_BASED_OUTPATIENT_CLINIC_OR_DEPARTMENT_OTHER)
Admission: RE | Admit: 2019-06-14 | Discharge: 2019-06-14 | Disposition: A | Discharge status: Home / Self Care | Payer: Self-pay | Attending: Orthopedic Surgery | Admitting: Orthopedic Surgery

## 2019-06-14 ENCOUNTER — Ambulatory Visit (HOSPITAL_COMMUNITY): Payer: Self-pay

## 2019-06-14 ENCOUNTER — Ambulatory Visit (HOSPITAL_BASED_OUTPATIENT_CLINIC_OR_DEPARTMENT_OTHER): Payer: Self-pay | Admitting: Anesthesiology

## 2019-06-14 DIAGNOSIS — Z8249 Family history of ischemic heart disease and other diseases of the circulatory system: Secondary | ICD-10-CM | POA: Insufficient documentation

## 2019-06-14 DIAGNOSIS — S42401A Unspecified fracture of lower end of right humerus, initial encounter for closed fracture: Secondary | ICD-10-CM

## 2019-06-14 DIAGNOSIS — F419 Anxiety disorder, unspecified: Secondary | ICD-10-CM | POA: Insufficient documentation

## 2019-06-14 DIAGNOSIS — W010XXA Fall on same level from slipping, tripping and stumbling without subsequent striking against object, initial encounter: Secondary | ICD-10-CM | POA: Insufficient documentation

## 2019-06-14 DIAGNOSIS — E785 Hyperlipidemia, unspecified: Secondary | ICD-10-CM | POA: Insufficient documentation

## 2019-06-14 DIAGNOSIS — S52121A Displaced fracture of head of right radius, initial encounter for closed fracture: Secondary | ICD-10-CM | POA: Insufficient documentation

## 2019-06-14 DIAGNOSIS — Z87442 Personal history of urinary calculi: Secondary | ICD-10-CM | POA: Insufficient documentation

## 2019-06-14 DIAGNOSIS — Z833 Family history of diabetes mellitus: Secondary | ICD-10-CM | POA: Insufficient documentation

## 2019-06-14 HISTORY — DX: Other specified postprocedural states: Z98.890

## 2019-06-14 HISTORY — DX: Nausea with vomiting, unspecified: R11.2

## 2019-06-14 HISTORY — PX: RADIAL HEAD ARTHROPLASTY: SHX6044

## 2019-06-14 SURGERY — ARTHROPLASTY, RADIUS, HEAD
Anesthesia: Regional | Site: Elbow | Laterality: Right

## 2019-06-14 MED ORDER — LIDOCAINE 2% (20 MG/ML) 5 ML SYRINGE
INTRAMUSCULAR | Status: AC
  Filled 2019-06-14: qty 5

## 2019-06-14 MED ORDER — ONDANSETRON HCL 4 MG/2ML IJ SOLN
INTRAMUSCULAR | Status: AC
  Filled 2019-06-14: qty 2

## 2019-06-14 MED ORDER — DEXAMETHASONE SODIUM PHOSPHATE 10 MG/ML IJ SOLN
INTRAMUSCULAR | Status: DC | PRN
  Administered 2019-06-14: 7 mg via INTRAVENOUS

## 2019-06-14 MED ORDER — ACETAMINOPHEN 10 MG/ML IV SOLN: 1000.0000 mg | Freq: Once | INTRAVENOUS | Status: DC | PRN

## 2019-06-14 MED ORDER — OXYCODONE HCL 5 MG PO TABS: 5.0000 mg | ORAL_TABLET | Freq: Four times a day (QID) | ORAL | 0 refills | Status: DC | PRN

## 2019-06-14 MED ORDER — PHENYLEPHRINE 40 MCG/ML (10ML) SYRINGE FOR IV PUSH (FOR BLOOD PRESSURE SUPPORT)
PREFILLED_SYRINGE | INTRAVENOUS | Status: AC
  Filled 2019-06-14: qty 10

## 2019-06-14 MED ORDER — CEFAZOLIN SODIUM-DEXTROSE 2-4 GM/100ML-% IV SOLN
2.0000 g | INTRAVENOUS | Status: AC
  Administered 2019-06-14: 2 g via INTRAVENOUS

## 2019-06-14 MED ORDER — ONDANSETRON HCL 4 MG/2ML IJ SOLN: 4.0000 mg | Freq: Once | INTRAMUSCULAR | Status: DC | PRN

## 2019-06-14 MED ORDER — MEPERIDINE HCL 25 MG/ML IJ SOLN: 6.2500 mg | INTRAMUSCULAR | Status: DC | PRN

## 2019-06-14 MED ORDER — HYDROMORPHONE HCL 1 MG/ML IJ SOLN: 0.2500 mg | INTRAMUSCULAR | Status: DC | PRN

## 2019-06-14 MED ORDER — CLONIDINE HCL (ANALGESIA) 100 MCG/ML EP SOLN
EPIDURAL | Status: DC | PRN
  Administered 2019-06-14: 100 ug

## 2019-06-14 MED ORDER — ACETAMINOPHEN 325 MG PO TABS: 650.0000 mg | ORAL_TABLET | Freq: Four times a day (QID) | ORAL | Status: DC

## 2019-06-14 MED ORDER — IBUPROFEN 200 MG PO TABS: 600.0000 mg | ORAL_TABLET | Freq: Four times a day (QID) | ORAL | 0 refills | Status: DC

## 2019-06-14 MED ORDER — PROPOFOL 10 MG/ML IV BOLUS
INTRAVENOUS | Status: AC
  Filled 2019-06-14: qty 20

## 2019-06-14 MED ORDER — LIDOCAINE HCL (CARDIAC) PF 100 MG/5ML IV SOSY
PREFILLED_SYRINGE | INTRAVENOUS | Status: DC | PRN
  Administered 2019-06-14: 80 mg via INTRAVENOUS

## 2019-06-14 MED ORDER — FENTANYL CITRATE (PF) 100 MCG/2ML IJ SOLN
INTRAMUSCULAR | Status: AC
  Filled 2019-06-14: qty 2

## 2019-06-14 MED ORDER — 0.9 % SODIUM CHLORIDE (POUR BTL) OPTIME
TOPICAL | Status: DC | PRN
  Administered 2019-06-14: 1000 mL

## 2019-06-14 MED ORDER — HYDROCODONE-ACETAMINOPHEN 7.5-325 MG PO TABS: 1.0000 | ORAL_TABLET | Freq: Once | ORAL | Status: DC | PRN

## 2019-06-14 MED ORDER — ONDANSETRON HCL 4 MG/2ML IJ SOLN
INTRAMUSCULAR | Status: DC | PRN
  Administered 2019-06-14: 4 mg via INTRAVENOUS

## 2019-06-14 MED ORDER — LACTATED RINGERS IV SOLN
INTRAVENOUS | Status: DC
  Administered 2019-06-14 (×2): via INTRAVENOUS

## 2019-06-14 MED ORDER — FENTANYL CITRATE (PF) 100 MCG/2ML IJ SOLN
50.0000 ug | INTRAMUSCULAR | Status: DC | PRN
  Administered 2019-06-14: 50 ug via INTRAVENOUS

## 2019-06-14 MED ORDER — MIDAZOLAM HCL 2 MG/2ML IJ SOLN
1.0000 mg | INTRAMUSCULAR | Status: DC | PRN
  Administered 2019-06-14: 2 mg via INTRAVENOUS

## 2019-06-14 MED ORDER — PROPOFOL 10 MG/ML IV BOLUS
INTRAVENOUS | Status: DC | PRN
  Administered 2019-06-14: 160 mg via INTRAVENOUS

## 2019-06-14 MED ORDER — ROPIVACAINE HCL 5 MG/ML IJ SOLN
INTRAMUSCULAR | Status: DC | PRN
  Administered 2019-06-14: 30 mL via PERINEURAL

## 2019-06-14 MED ORDER — DEXAMETHASONE SODIUM PHOSPHATE 10 MG/ML IJ SOLN
INTRAMUSCULAR | Status: AC
  Filled 2019-06-14: qty 1

## 2019-06-14 MED ORDER — EPHEDRINE 5 MG/ML INJ
INTRAVENOUS | Status: AC
  Filled 2019-06-14: qty 10

## 2019-06-14 MED ORDER — CHLORHEXIDINE GLUCONATE 4 % EX LIQD: 60.0000 mL | Freq: Once | CUTANEOUS | Status: DC

## 2019-06-14 MED ORDER — EPHEDRINE SULFATE-NACL 50-0.9 MG/10ML-% IV SOSY
PREFILLED_SYRINGE | INTRAVENOUS | Status: DC | PRN
  Administered 2019-06-14 (×5): 5 mg via INTRAVENOUS

## 2019-06-14 MED ORDER — MIDAZOLAM HCL 2 MG/2ML IJ SOLN
INTRAMUSCULAR | Status: AC
  Filled 2019-06-14: qty 2

## 2019-06-14 MED ORDER — CEFAZOLIN SODIUM-DEXTROSE 2-4 GM/100ML-% IV SOLN
INTRAVENOUS | Status: AC
  Filled 2019-06-14: qty 100

## 2019-06-14 MED ORDER — PHENYLEPHRINE 40 MCG/ML (10ML) SYRINGE FOR IV PUSH (FOR BLOOD PRESSURE SUPPORT)
PREFILLED_SYRINGE | INTRAVENOUS | Status: DC | PRN
  Administered 2019-06-14: 40 ug via INTRAVENOUS
  Administered 2019-06-14 (×2): 80 ug via INTRAVENOUS

## 2019-06-14 SURGICAL SUPPLY — 74 items
ANCHOR SUT 1.45 SZ 1 SHORT (Anchor) ×2 IMPLANT
BLADE AVERAGE 25X9 (BLADE) ×1 IMPLANT
BLADE SURG 15 STRL LF DISP TIS (BLADE) ×1 IMPLANT
BLADE SURG 15 STRL SS (BLADE) ×2
BNDG COHESIVE 4X5 TAN STRL (GAUZE/BANDAGES/DRESSINGS) ×3 IMPLANT
BNDG ESMARK 4X9 LF (GAUZE/BANDAGES/DRESSINGS) ×2 IMPLANT
BNDG GAUZE ELAST 4 BULKY (GAUZE/BANDAGES/DRESSINGS) ×2 IMPLANT
BRUSH SCRUB EZ PLAIN DRY (MISCELLANEOUS) ×1 IMPLANT
CANISTER SUCT 1200ML W/VALVE (MISCELLANEOUS) ×2 IMPLANT
CHLORAPREP W/TINT 26 (MISCELLANEOUS) ×2 IMPLANT
CORD BIPOLAR FORCEPS 12FT (ELECTRODE) ×2 IMPLANT
COVER BACK TABLE REUSABLE LG (DRAPES) ×2 IMPLANT
COVER MAYO STAND REUSABLE (DRAPES) ×2 IMPLANT
COVER WAND RF STERILE (DRAPES) IMPLANT
CUFF TOURN SGL QUICK 18X3 (MISCELLANEOUS) ×1 IMPLANT
CUFF TOURN SGL QUICK 18X4 (TOURNIQUET CUFF) IMPLANT
CUFF TOURN SGL QUICK 24 (TOURNIQUET CUFF)
CUFF TRNQT CYL 24X4X16.5-23 (TOURNIQUET CUFF) IMPLANT
DRAIN PENROSE 1/4X12 LTX STRL (WOUND CARE) IMPLANT
DRAPE C-ARM 42X72 X-RAY (DRAPES) ×2 IMPLANT
DRAPE EXTREMITY T 121X128X90 (DISPOSABLE) ×1 IMPLANT
DRAPE SURG 17X23 STRL (DRAPES) ×3 IMPLANT
DRAPE U-SHAPE 76X120 STRL (DRAPES) IMPLANT
DRSG ADAPTIC 3X8 NADH LF (GAUZE/BANDAGES/DRESSINGS) IMPLANT
DRSG EMULSION OIL 3X3 NADH (GAUZE/BANDAGES/DRESSINGS) IMPLANT
ELECT REM PT RETURN 9FT ADLT (ELECTROSURGICAL) ×2
ELECTRODE REM PT RTRN 9FT ADLT (ELECTROSURGICAL) ×1 IMPLANT
GAUZE SPONGE 4X4 12PLY STRL LF (GAUZE/BANDAGES/DRESSINGS) ×3 IMPLANT
GLOVE BIO SURGEON STRL SZ7.5 (GLOVE) ×2 IMPLANT
GLOVE BIOGEL PI IND STRL 7.0 (GLOVE) ×1 IMPLANT
GLOVE BIOGEL PI IND STRL 8 (GLOVE) ×1 IMPLANT
GLOVE BIOGEL PI INDICATOR 7.0 (GLOVE) ×4
GLOVE BIOGEL PI INDICATOR 8 (GLOVE) ×1
GLOVE ECLIPSE 6.5 STRL STRAW (GLOVE) ×4 IMPLANT
GOWN STRL REUS W/ TWL LRG LVL3 (GOWN DISPOSABLE) IMPLANT
GOWN STRL REUS W/TWL LRG LVL3 (GOWN DISPOSABLE) ×4
GOWN STRL REUS W/TWL XL LVL3 (GOWN DISPOSABLE) ×2 IMPLANT
IMPL HEAD (Orthopedic Implant) IMPLANT
IMPL STEM WITH SCREW 8X29 (Stem) IMPLANT
IMPLANT HEAD (Orthopedic Implant) ×2 IMPLANT
IMPLANT STEM WITH SCREW 8X29 (Stem) ×2 IMPLANT
NDL HYPO 25X1 1.5 SAFETY (NEEDLE) IMPLANT
NEEDLE HYPO 25X1 1.5 SAFETY (NEEDLE) IMPLANT
NS IRRIG 1000ML POUR BTL (IV SOLUTION) ×2 IMPLANT
PACK BASIN DAY SURGERY FS (CUSTOM PROCEDURE TRAY) ×2 IMPLANT
PADDING CAST ABS 4INX4YD NS (CAST SUPPLIES) ×2
PADDING CAST ABS COTTON 4X4 ST (CAST SUPPLIES) IMPLANT
PENCIL BUTTON HOLSTER BLD 10FT (ELECTRODE) ×2 IMPLANT
SLEEVE SCD COMPRESS KNEE MED (MISCELLANEOUS) ×2 IMPLANT
SLING ARM FOAM STRAP LRG (SOFTGOODS) ×1 IMPLANT
SPLINT FAST PLASTER 5X30 (CAST SUPPLIES)
SPLINT FIBERGLASS 3X35 (CAST SUPPLIES) ×1 IMPLANT
SPLINT PLASTER CAST FAST 5X30 (CAST SUPPLIES) ×1 IMPLANT
SPONGE LAP 18X18 RF (DISPOSABLE) ×2 IMPLANT
STAPLER VISISTAT 35W (STAPLE) IMPLANT
STOCKINETTE 6  STRL (DRAPES) ×1
STOCKINETTE 6 STRL (DRAPES) ×1 IMPLANT
SUCTION FRAZIER HANDLE 10FR (MISCELLANEOUS) ×1
SUCTION TUBE FRAZIER 10FR DISP (MISCELLANEOUS) IMPLANT
SUT BROADBAND TAPE 2PK 1.5 (SUTURE) ×1 IMPLANT
SUT VIC AB 0 CT3 27 (SUTURE) ×2 IMPLANT
SUT VIC AB 2-0 PS2 27 (SUTURE) ×2 IMPLANT
SUT VIC AB 2-0 SH 27 (SUTURE) ×1
SUT VIC AB 2-0 SH 27XBRD (SUTURE) IMPLANT
SUT VIC AB 3-0 SH 27 (SUTURE) ×1
SUT VIC AB 3-0 SH 27X BRD (SUTURE) IMPLANT
SUT VICRYL 4-0 PS2 18IN ABS (SUTURE) IMPLANT
SUT VICRYL RAPIDE 4/0 PS 2 (SUTURE) IMPLANT
SYR 10ML LL (SYRINGE) IMPLANT
SYR BULB 3OZ (MISCELLANEOUS) ×2 IMPLANT
TOWEL GREEN STERILE FF (TOWEL DISPOSABLE) ×4 IMPLANT
TUBE CONNECTING 20X1/4 (TUBING) ×2 IMPLANT
UNDERPAD 30X36 HEAVY ABSORB (UNDERPADS AND DIAPERS) ×1 IMPLANT
YANKAUER SUCT BULB TIP NO VENT (SUCTIONS) ×2 IMPLANT

## 2019-06-14 NOTE — Op Note (Signed)
06/14/2019  1:01 PM  PATIENT:  [name] [name]  63 y.o. male  PRE-OPERATIVE DIAGNOSIS:  Right elbow fracture-dislocation  POST-OPERATIVE DIAGNOSIS:  Same  PROCEDURE:   1.  Right elbow radial head replacement for fracture    2.  Right elbow lateral collateral ligament complex repair    3.  Right elbow medial collateral ligament complex repair  SURGEON: Rayvon Char. Grandville Silos, MD  PHYSICIAN ASSISTANT: Morley Kos, OPA-C  ANESTHESIA:  regional and MAC  SPECIMENS:  None  DRAINS:   None  EBL:  less than 50 mL  PREOPERATIVE INDICATIONS:  [name] is a  63 y.o. male with a right elbow fracture dislocation, now grossly reduced, but in need of definitive operative management  The risks benefits and alternatives were discussed with the patient preoperatively including but not limited to the risks of infection, bleeding, nerve injury, cardiopulmonary complications, the need for revision surgery, among others, and the patient verbalized understanding and consented to proceed.  OPERATIVE IMPLANTS: Biomet radial head replacement, size 8 stem, 10x47m head, and 1.472mJuggerknots x 2  OPERATIVE PROCEDURE:  After receiving prophylactic antibiotics and a regional block, the patient was escorted to the operative theatre and placed in a supine position.  General anesthesia was administered.  A surgical "time-out" was performed during which the planned procedure, proposed operative site, and the correct patient identity were compared to the operative consent and agreement confirmed by the circulating nurse according to current facility policy.  The exposed skin was prepped with Chloraprep and draped in the usual sterile fashion.  A sterile tourniquet was applied.  The limb was exsanguinated with an Esmarch bandage and the tourniquet inflated to approximately 10056m higher than systolic BP.  A lateral incision was marked and made over the elbow, elevating full-thickness flaps.  The interval  between ECRB and EDC was exploited.  The soft tissues have been completely avulsed off the lateral aspect of the humerus, with some small avulsion fragments that were removed.  There was also split in the capsule on the anterolateral aspect of it.  The radial head was examined, and about half of it had been sheared off actually, into more than 1 fragment.  It was decided to proceed with replacement rather than repair.  The fragments were removed, as was any coronoid fragments.  The radial head was removed with a small saw.  A neck cut was made at about 14 mm to allow for the implant.  The neck was then prepared first with the planar, followed by broaching up to a size 8.  The best fitting head circumference was a 22, and seven 8 x 22 implant with a length of 10 was selected and implanted.  It appeared to be the appropriate size, moving smoothly on the capitellum.  The trial implants were removed and the standard implants were placed.  In the process of impacting the stem, a longitudinal split in the radial neck was identified.  Arthroscopic tape was then used as a cerclage tape around the neck, tied snugly.  The head was then placed onto the stem and the joint reduced.  It moves smoothly and fluidly, but was still obviously unstable.  Attention was then shifted to the lateral ligament repair 1.4 mm juggernaut anchor was placed in the lateral collateral ligaments secured to the lateral epicondyle.  This improve the stability dramatically.  The wound was irrigated and the remainder of the lateral soft tissues closed with running 2-0 Vicryl suture.  The elbow was  stable to about 45 to 30 degrees of extension, then appeared to have a slight bit of subluxation.  In addition at that point, applied valgus stress revealed at least 2 to 3 mm of medial opening.  Decision was made to proceed with medial ligament repair.  The small curved incision was made over the medial aspect of the elbow, with spreading dissection down to  the deep fascia.  The flexor pronator mass was found to be split such that I could place my finger through it and directly into the joint.  Working through this split, the medial collateral ligament stump was found to have been avulsed off of the medial epicondyle.  Another similar anchor was placed in the medial collateral ligament repaired, with the joint held reduced in about 60 degrees of flexion.  The capsular split was also repaired with 2-0 Vicryl interrupted suture, further increasing stability.  This was again judged fluoroscopically and now the elbow could be nearly fully extended before any degree of subluxation was noticed.  In addition, it was much more stable to applied valgus stress.  Care was noted during placement of the sutures in the collateral ligament that the ulnar nerve was intact and not caught within the repair.  The flexor pronator longitudinal split was repaired with running 2-0 Vicryl suture.  Final images were obtained and both wounds were irrigated.  The tourniquet was released and both wounds were closed with 3-0 Vicryl deep dermal buried subcuticular sutures and staples in the skin.  A long-arm splint dressing was applied with the forearm pronated about 45 degrees and he was taken to the recovery room in stable condition, breathing spontaneously  DISPOSITION: He will be discharged home today with typical instructions, returning in 10 to 15 days with new x-rays of the operative elbow in the splint, as well as removal of the staples of the wounds appear appropriate..  At which time, we will place him in a sling, he will be sent to therapy for a custom fabricated long-arm splint that places the forearm in about 45 degrees of pronation and the elbow at 90 degrees and begin rehab, mostly initially with active assisted range of motion supine, with the distal end of the humerus facing upward (inverted flexion).

## 2019-06-14 NOTE — Anesthesia Procedure Notes (Signed)
Anesthesia Regional Block: Supraclavicular block   Pre-Anesthetic Checklist: ,, timeout performed, Correct Patient, Correct Site, Correct Laterality, Correct Procedure, Correct Position, site marked, Risks and benefits discussed,  Surgical consent,  Pre-op evaluation,  At surgeon's request and post-op pain management  Laterality: Right  Prep: chloraprep       Needles:  Injection technique: Single-shot  Needle Type: Echogenic Needle     Needle Length: 5cm  Needle Gauge: 21     Additional Needles:   Procedures:,,,, ultrasound used (permanent image in chart),,,,  Narrative:  Start time: 06/14/2019 12:03 PM End time: 06/14/2019 12:11 PM Injection made incrementally with aspirations every 5 mL.  Performed by: Personally  Anesthesiologist: Barnet Glasgow, MD

## 2019-06-14 NOTE — Interval H&P Note (Signed)
History and Physical Interval Note:  06/14/2019 1:00 PM  [name] [name]  has presented today for surgery, with the diagnosis of RIGHT ELBOW FRACTURE DISLOCATION.  The various methods of treatment have been discussed with the patient and family. After consideration of risks, benefits and other options for treatment, the patient has consented to  Procedure(s) with comments: Douglas (Right) - SURGERY REQUEST TIME 2 HOURS PRE OP BLOCK as a surgical intervention.  The patient's history has been reviewed, patient examined, no change in status, stable for surgery.  I have reviewed the patient's chart and labs.  Questions were answered to the patient's satisfaction.     Jolyn Nap

## 2019-06-14 NOTE — Anesthesia Procedure Notes (Signed)
Procedure Name: LMA Insertion Date/Time: 06/14/2019 1:20 PM Performed by: Raenette Rover, CRNA Pre-anesthesia Checklist: Patient identified, Emergency Drugs available, Suction available and Patient being monitored Patient Re-evaluated:Patient Re-evaluated prior to induction Oxygen Delivery Method: Circle system utilized Preoxygenation: Pre-oxygenation with 100% oxygen Induction Type: IV induction LMA: LMA inserted LMA Size: 4.0 Number of attempts: 1 Placement Confirmation: positive ETCO2 and breath sounds checked- equal and bilateral Tube secured with: Tape Dental Injury: Teeth and Oropharynx as per pre-operative assessment

## 2019-06-14 NOTE — Anesthesia Postprocedure Evaluation (Signed)
Anesthesia Post Note  Patient: Caydan Mctavish Knudsen  Procedure(s) Performed: RIGHT ELBOW RADIAL HEAD REPLACEMENT (Right Elbow)     Patient location during evaluation: PACU Anesthesia Type: Regional and MAC Level of consciousness: awake and alert, oriented and patient cooperative Pain management: pain level controlled Vital Signs Assessment: post-procedure vital signs reviewed and stable Respiratory status: spontaneous breathing, nonlabored ventilation and respiratory function stable Cardiovascular status: blood pressure returned to baseline and stable Postop Assessment: no apparent nausea or vomiting Anesthetic complications: no    Last Vitals:  Vitals:   06/14/19 1516 06/14/19 1530  BP: 119/71 131/83  Pulse: 86 87  Resp: 14 11  Temp: 36.5 C   SpO2: 95% 98%    Last Pain:  Vitals:   06/14/19 1530  PainSc: 0-No pain                 Jarome Matin Shawonda Kerce

## 2019-06-14 NOTE — Progress Notes (Signed)
Assisted Dr. Houser with right, ultrasound guided, supraclavicular block. Side rails up, monitors on throughout procedure. See vital signs in flow sheet. Tolerated Procedure well. 

## 2019-06-14 NOTE — Discharge Instructions (Signed)
Discharge Instructions   You have a dressing with a plaster splint incorporated in it. Move your fingers as much as possible, making a full fist and fully opening the fist. Elevate your hand to reduce pain & swelling of the digits.  Ice over the operative site may be helpful to reduce pain & swelling.  DO NOT USE HEAT. Take Ibuprofen 600 mg and Tylenol 650 mg TOGETHER every 6 hours. Additionally, take Oxycodone 5 mg for severe post operative pain. Leave the dressing in place until you return to our office.  You may shower, but keep the bandage clean & dry.  You may drive a car when you are off of prescription pain medications and can safely control your vehicle with both hands. Our office will call you to arrange follow-up   Please call [phone] during normal business hours or [phone] after hours for any problems. Including the following:  - excessive redness of the incisions - drainage for more than 4 days - fever of more than 101.5 F  *Please note that pain medications will not be refilled after hours or on weekends.    Post Anesthesia Home Care Instructions  Activity: Get plenty of rest for the remainder of the day. A responsible individual must stay with you for 24 hours following the procedure.  For the next 24 hours, DO NOT: -Drive a car -Paediatric nurse -Drink alcoholic beverages -Take any medication unless instructed by your physician -Make any legal decisions or sign important papers.  Meals: Start with liquid foods such as gelatin or soup. Progress to regular foods as tolerated. Avoid greasy, spicy, heavy foods. If nausea and/or vomiting occur, drink only clear liquids until the nausea and/or vomiting subsides. Call your physician if vomiting continues.  Special Instructions/Symptoms: Your throat may feel dry or sore from the anesthesia or the breathing tube placed in your throat during surgery. If this causes discomfort, gargle with warm salt water. The  discomfort should disappear within 24 hours.  If you had a scopolamine patch placed behind your ear for the management of post- operative nausea and/or vomiting:  1. The medication in the patch is effective for 72 hours, after which it should be removed.  Wrap patch in a tissue and discard in the trash. Wash hands thoroughly with soap and water. 2. You may remove the patch earlier than 72 hours if you experience unpleasant side effects which may include dry mouth, dizziness or visual disturbances. 3. Avoid touching the patch. Wash your hands with soap and water after contact with the patch.      Regional Anesthesia Blocks  1. Numbness or the inability to move the "blocked" extremity may last from 3-48 hours after placement. The length of time depends on the medication injected and your individual response to the medication. If the numbness is not going away after 48 hours, call your surgeon.  2. The extremity that is blocked will need to be protected until the numbness is gone and the  Strength has returned. Because you cannot feel it, you will need to take extra care to avoid injury. Because it may be weak, you may have difficulty moving it or using it. You may not know what position it is in without looking at it while the block is in effect.  3. For blocks in the legs and feet, returning to weight bearing and walking needs to be done carefully. You will need to wait until the numbness is entirely gone and the strength has returned. You  should be able to move your leg and foot normally before you try and bear weight or walk. You will need someone to be with you when you first try to ensure you do not fall and possibly risk injury.  4. Bruising and tenderness at the needle site are common side effects and will resolve in a few days.  5. Persistent numbness or new problems with movement should be communicated to the surgeon or the [place] [phone] [place] [phone]).

## 2019-06-14 NOTE — Transfer of Care (Signed)
Immediate Anesthesia Transfer of Care Note  Patient: [name] [name]  Procedure(s) Performed: RIGHT ELBOW RADIAL HEAD REPLACEMENT (Right Elbow)  Patient Location: PACU  Anesthesia Type:GA combined with regional for post-op pain  Level of Consciousness: awake, alert , oriented, drowsy and patient cooperative  Airway & Oxygen Therapy: Patient Spontanous Breathing and Patient connected to nasal cannula oxygen  Post-op Assessment: Report given to RN and Post -op Vital signs reviewed and stable  Post vital signs: Reviewed and stable  Last Vitals:  Vitals Value Taken Time  BP 119/71 06/14/19 1516  Temp    Pulse 96 06/14/19 1519  Resp 16 06/14/19 1519  SpO2 98 % 06/14/19 1519  Vitals shown include unvalidated device data.  Last Pain:  Vitals:   06/14/19 1140  PainSc: 0-No pain      Patients Stated Pain Goal: 4 ([id number])  Complications: No apparent anesthesia complications

## 2019-06-17 ENCOUNTER — Encounter (HOSPITAL_BASED_OUTPATIENT_CLINIC_OR_DEPARTMENT_OTHER): Payer: Self-pay | Admitting: Orthopedic Surgery

## 2019-06-20 ENCOUNTER — Other Ambulatory Visit: Payer: Self-pay | Admitting: Family Medicine

## 2019-07-09 ENCOUNTER — Ambulatory Visit: Admit: 2019-07-09 | Payer: Self-pay | Admitting: Urology

## 2019-10-04 ENCOUNTER — Telehealth: Payer: Self-pay

## 2019-10-04 NOTE — Telephone Encounter (Signed)
Pt left v/m wanting to know if ever had a shingles shot and if not Does Dr Damita Dunnings want pt to have shingles vaccine? I do not see shingles vaccine listed on immunization list. Last annual visit on 03/20/18 and no future appt scheduled.Please advise.

## 2019-10-06 NOTE — Telephone Encounter (Signed)
In general, yes, he should get Shingrix when possible.  He should check with his insurance to see if it is cheaper at the pharmacy or here at the clinic.  More importantly, if he can get a Covid shot then I would do that first and I would push the Shingrix shot back.  Thanks.

## 2019-10-07 NOTE — Telephone Encounter (Signed)
Tried to contact pt by phone but no answer and no VM. Sent mychart msg.

## 2019-10-10 ENCOUNTER — Other Ambulatory Visit: Payer: Self-pay

## 2019-10-11 ENCOUNTER — Other Ambulatory Visit: Payer: Self-pay

## 2019-10-11 ENCOUNTER — Other Ambulatory Visit: Payer: Self-pay | Admitting: Family Medicine

## 2019-10-11 DIAGNOSIS — E785 Hyperlipidemia, unspecified: Secondary | ICD-10-CM

## 2019-10-16 ENCOUNTER — Other Ambulatory Visit: Payer: Self-pay

## 2019-10-16 ENCOUNTER — Other Ambulatory Visit (INDEPENDENT_AMBULATORY_CARE_PROVIDER_SITE_OTHER): Payer: Self-pay

## 2019-10-16 DIAGNOSIS — E785 Hyperlipidemia, unspecified: Secondary | ICD-10-CM

## 2019-10-16 NOTE — Addendum Note (Signed)
Addended by: Cloyd Stagers on: 10/16/2019 08:32 AM   Modules accepted: Orders

## 2019-10-17 ENCOUNTER — Encounter: Payer: Self-pay | Admitting: Family Medicine

## 2019-10-17 ENCOUNTER — Ambulatory Visit (INDEPENDENT_AMBULATORY_CARE_PROVIDER_SITE_OTHER): Payer: Self-pay | Admitting: Family Medicine

## 2019-10-17 ENCOUNTER — Other Ambulatory Visit: Payer: Self-pay

## 2019-10-17 VITALS — BP 138/68 | HR 74 | Temp 97.6°F | Ht 69.0 in | Wt 177.4 lb

## 2019-10-17 DIAGNOSIS — Z1211 Encounter for screening for malignant neoplasm of colon: Secondary | ICD-10-CM

## 2019-10-17 DIAGNOSIS — Z0000 Encounter for general adult medical examination without abnormal findings: Secondary | ICD-10-CM

## 2019-10-17 DIAGNOSIS — Z7189 Other specified counseling: Secondary | ICD-10-CM

## 2019-10-17 DIAGNOSIS — E785 Hyperlipidemia, unspecified: Secondary | ICD-10-CM

## 2019-10-17 LAB — COMPREHENSIVE METABOLIC PANEL
AG Ratio: 2 (calc) (ref 1.0–2.5)
ALT: 29 U/L (ref 9–46)
AST: 21 U/L (ref 10–35)
Albumin: 4.3 g/dL (ref 3.6–5.1)
Alkaline phosphatase (APISO): 69 U/L (ref 35–144)
BUN: 18 mg/dL (ref 7–25)
CO2: 29 mmol/L (ref 20–32)
Calcium: 9.5 mg/dL (ref 8.6–10.3)
Chloride: 106 mmol/L (ref 98–110)
Creat: 1.24 mg/dL (ref 0.70–1.25)
Globulin: 2.2 g/dL (calc) (ref 1.9–3.7)
Glucose, Bld: 101 mg/dL — ABNORMAL HIGH (ref 65–99)
Potassium: 4.4 mmol/L (ref 3.5–5.3)
Sodium: 141 mmol/L (ref 135–146)
Total Bilirubin: 0.8 mg/dL (ref 0.2–1.2)
Total Protein: 6.5 g/dL (ref 6.1–8.1)

## 2019-10-17 LAB — LIPID PANEL
Cholesterol: 159 mg/dL (ref <200)
HDL: 39 mg/dL — ABNORMAL LOW (ref >=40)
LDL Cholesterol (Calc): 95 mg/dL (calc)
Non-HDL Cholesterol (Calc): 120 mg/dL (calc) (ref <130)
Total CHOL/HDL Ratio: 4.1 (calc) (ref <5.0)
Triglycerides: 146 mg/dL (ref <150)

## 2019-10-17 LAB — PSA: PSA: 0.7 ng/mL (ref <=4.0)

## 2019-10-17 MED ORDER — PRAVASTATIN SODIUM 40 MG PO TABS: 40.0000 mg | ORAL_TABLET | Freq: Every day | ORAL | 3 refills | Status: DC

## 2019-10-17 NOTE — Patient Instructions (Addendum)
Pneumonia shot due at 40.  Check with your insurance to see if they will cover the shingles and tetanus shots.  They may be cheaper at the pharmacy.  I would wait 1 month after 2nd covid vaccine.   We can set up cologuard this summer.  Don't change your meds for now.  Take care.  Glad to see you.

## 2019-10-17 NOTE — Progress Notes (Signed)
This visit occurred during the SARS-CoV-2 public health emergency.  Safety protocols were in place, including screening questions prior to the visit, additional usage of staff PPE, and extensive cleaning of exam room while observing appropriate contact time as indicated for disinfecting solutions.  He is going to get a covid vaccine tomorrow.  D/w pt.   Would defer tetanus, PNA and shingles vaccine.  D/w pt, since he is getting ready to get his Covid vaccine. Living will d/w pt. Wife designated if patient were incapacitated.  Diet and exercise d/w pt.  Prostate cancer screening and PSA options (with potential risks and benefits of testing vs not testing) were discussed along with recent recs/guidelines.  He opted for testing PSA at this point.  See notes on labs. D/w patient [id number] for colon cancer screening, including IFOB vs. colonoscopy.  Risks and benefits of both were discussed and patient voiced understanding.  Pt elects for cologuard at 32, which is soon.  We can set that up later this year.  Elevated Cholesterol: Using medications without problems: yes Muscle aches: no Diet compliance: d/w pt.  Exercise: yes Labs d/w pt.   His wife is going through cancer treatment, d/w pt.    His elbow is slowly getting better, d/w pt.  He was expecting 1 year for full recovery.    PMH and SH reviewed  Meds, vitals, and allergies reviewed.   ROS: Per HPI.  Unless specifically indicated otherwise in HPI, the patient denies:  General: fever. Eyes: acute vision changes ENT: sore throat Cardiovascular: chest pain Respiratory: SOB GI: vomiting GU: dysuria Musculoskeletal: acute back pain Derm: acute rash Neuro: acute motor dysfunction Psych: worsening mood Endocrine: polydipsia Heme: bleeding Allergy: hayfever  GEN: nad, alert and oriented HEENT: ncat NECK: supple w/o LA CV: rrr. PULM: ctab, no inc wob ABD: soft, +bs EXT: no edema SKIN: no acute rash, well-healed scar on right  elbow.

## 2019-10-20 NOTE — Assessment & Plan Note (Signed)
He is going to get a covid vaccine tomorrow.  D/w pt.   Would defer tetanus, PNA and shingles vaccine.  D/w pt, since he is getting ready to get his Covid vaccine. Living will d/w pt. Wife designated if patient were incapacitated.  Diet and exercise d/w pt.  Prostate cancer screening and PSA options (with potential risks and benefits of testing vs not testing) were discussed along with recent recs/guidelines.  He opted for testing PSA at this point.  See notes on labs. D/w patient [id number] for colon cancer screening, including IFOB vs. colonoscopy.  Risks and benefits of both were discussed and patient voiced understanding.  Pt elects for cologuard at 62, which is soon.  We can set that up later this year.

## 2019-10-20 NOTE — Assessment & Plan Note (Signed)
Living will d/w pt.  Wife designated if patient were incapacitated.   ?

## 2019-10-20 NOTE — Assessment & Plan Note (Signed)
No change in meds.  Continue work on diet and exercise.  Continue statin.  Labs discussed with patient.  He will update me as needed.

## 2019-11-19 ENCOUNTER — Other Ambulatory Visit: Payer: Self-pay | Admitting: Hematology and Oncology

## 2020-01-16 IMAGING — DX DG ELBOW 2V*R*
2 series · 2 of 2 positions shown · non-contrast
Comparison: None.

CLINICAL DATA: Right elbow pain after trip and fall. Initial
encounter.

EXAM:
RIGHT ELBOW - 2 VIEW

[elbow ap (1 of 2)]
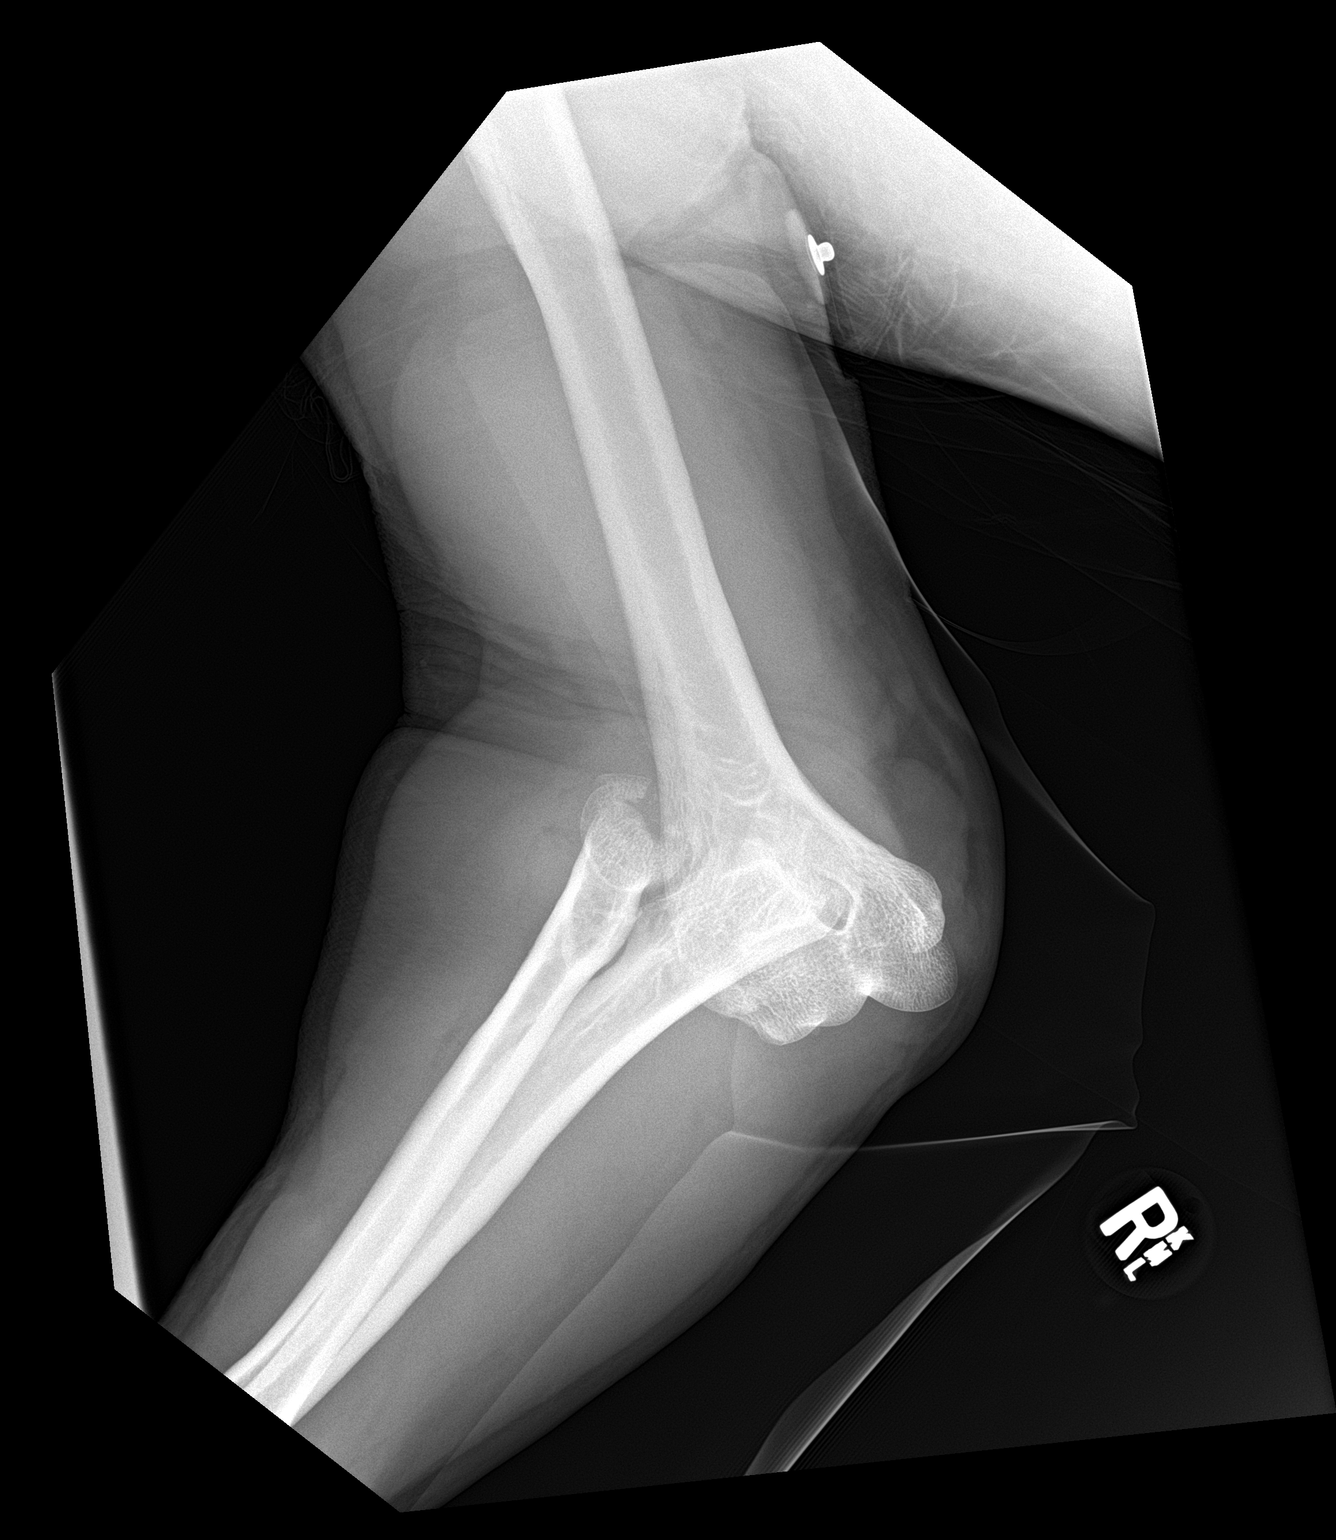

[elbow ap (2 of 2)]
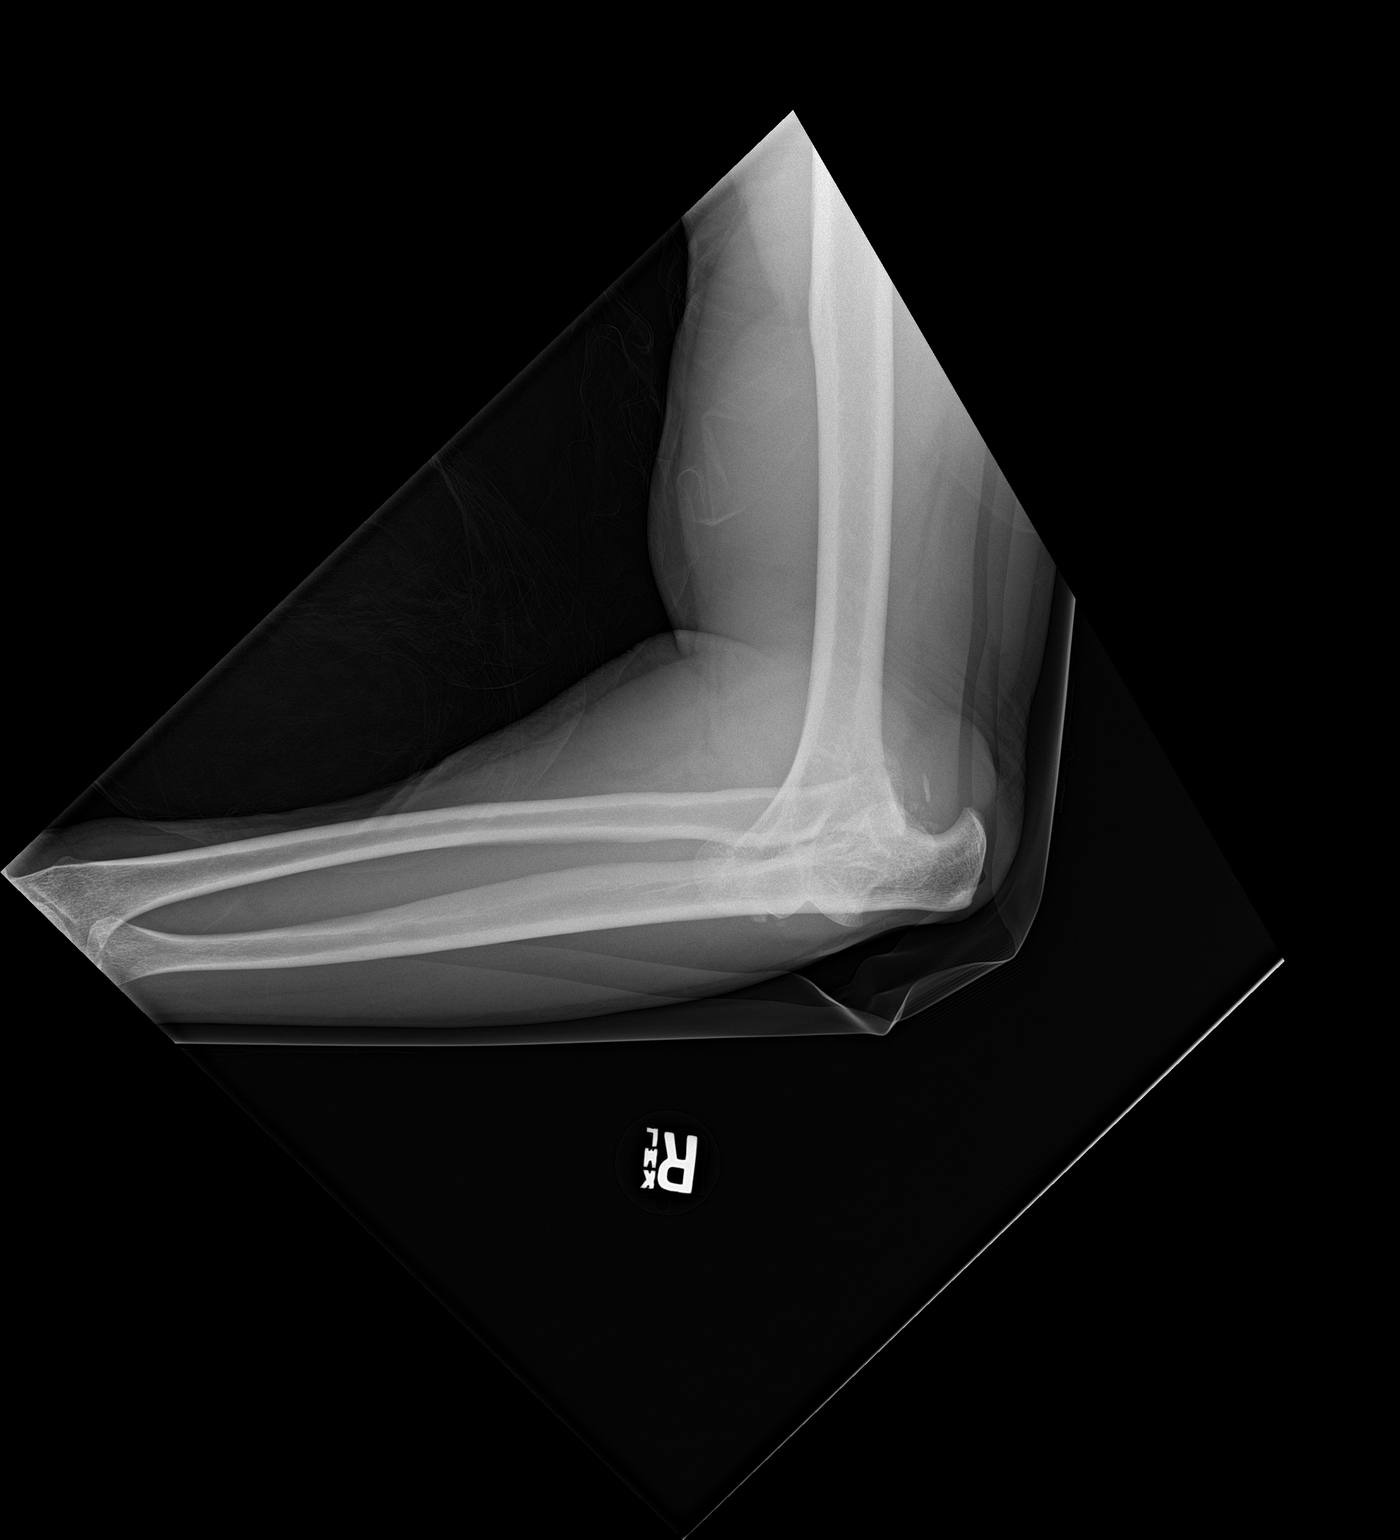

[2 of 2 positions shown; findings below may reference images not displayed]

FINDINGS: The elbow is laterally and posteriorly dislocated. There is a
comminuted fracture of the radial head. Several tiny bone fragments
project above the olecranon fossa. Marked soft tissue swelling is
present.
IMPRESSION: Lateral and posterior dislocation of the right elbow.

Comminuted fracture of the radial head.

## 2020-01-25 IMAGING — RF DG C-ARM 1-60 MIN
1 series · 5 of 5 positions shown · non-contrast
Comparison: June 05, 2019.

CLINICAL DATA: Radial head arthroplasty.

EXAM:
RIGHT ELBOW - COMPLETE 3+ VIEW; DG C-ARM 1-60 MIN
FLUOROSCOPY TIME:  1 minutes 7 seconds.

[Series 1: run · 5 of 5 slices shown]
[im 1/5]
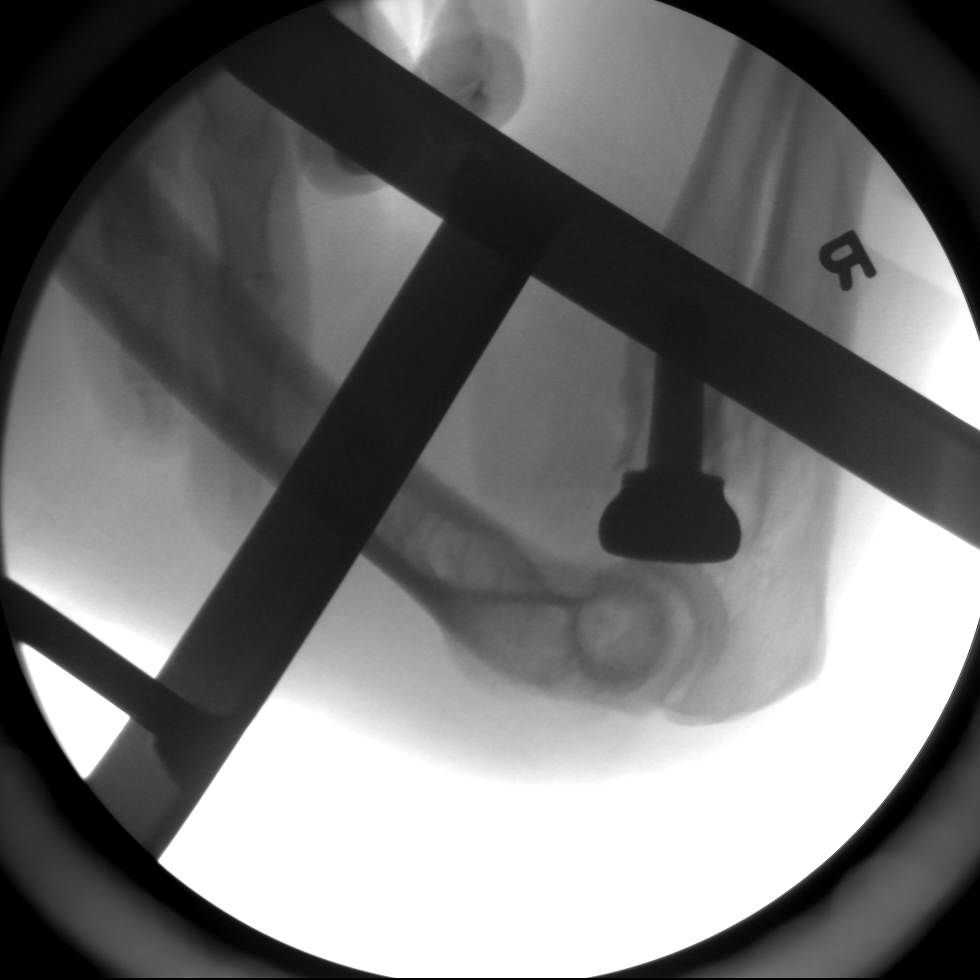
[im 2/5]
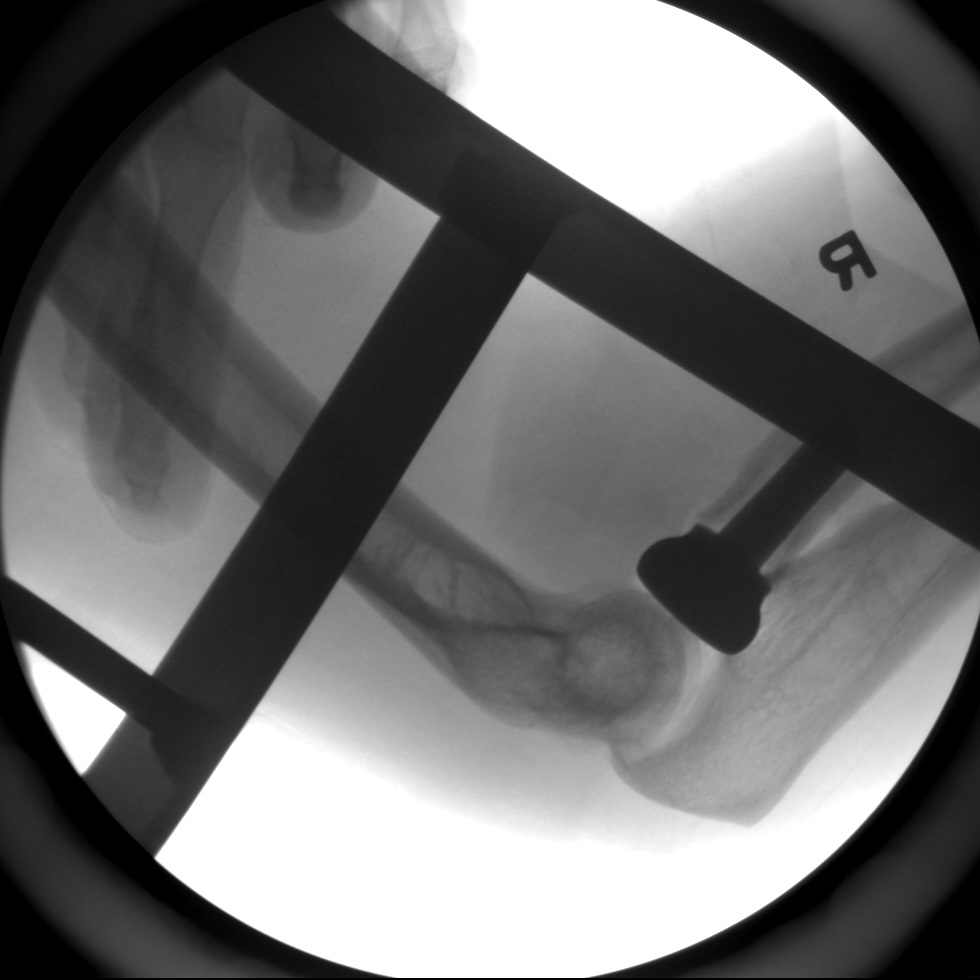
[im 3/5]
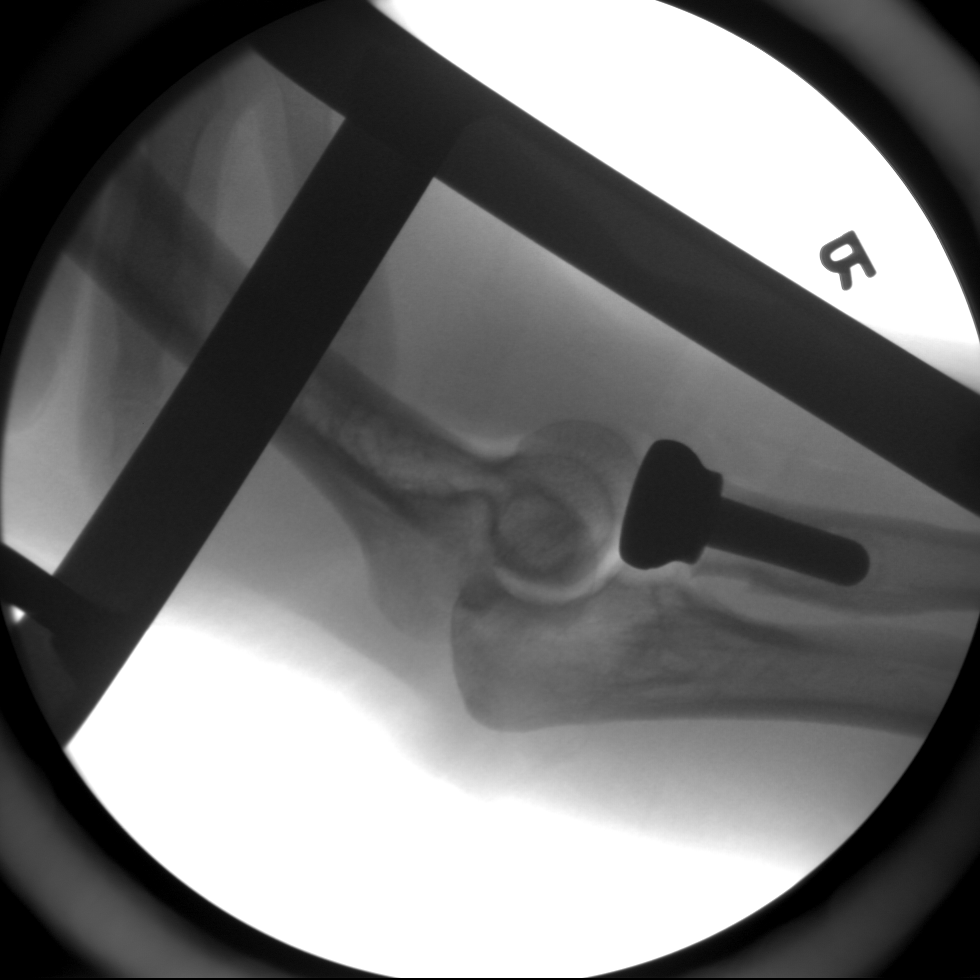
[im 4/5]
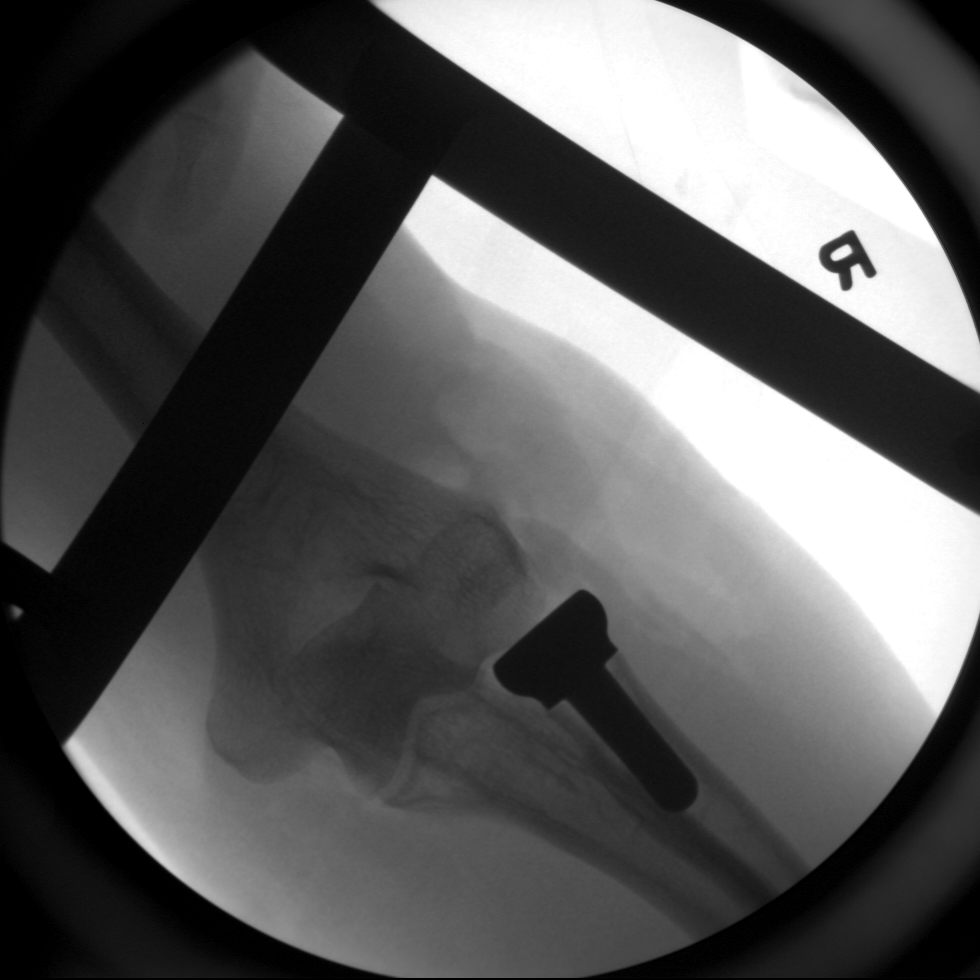
[im 5/5]
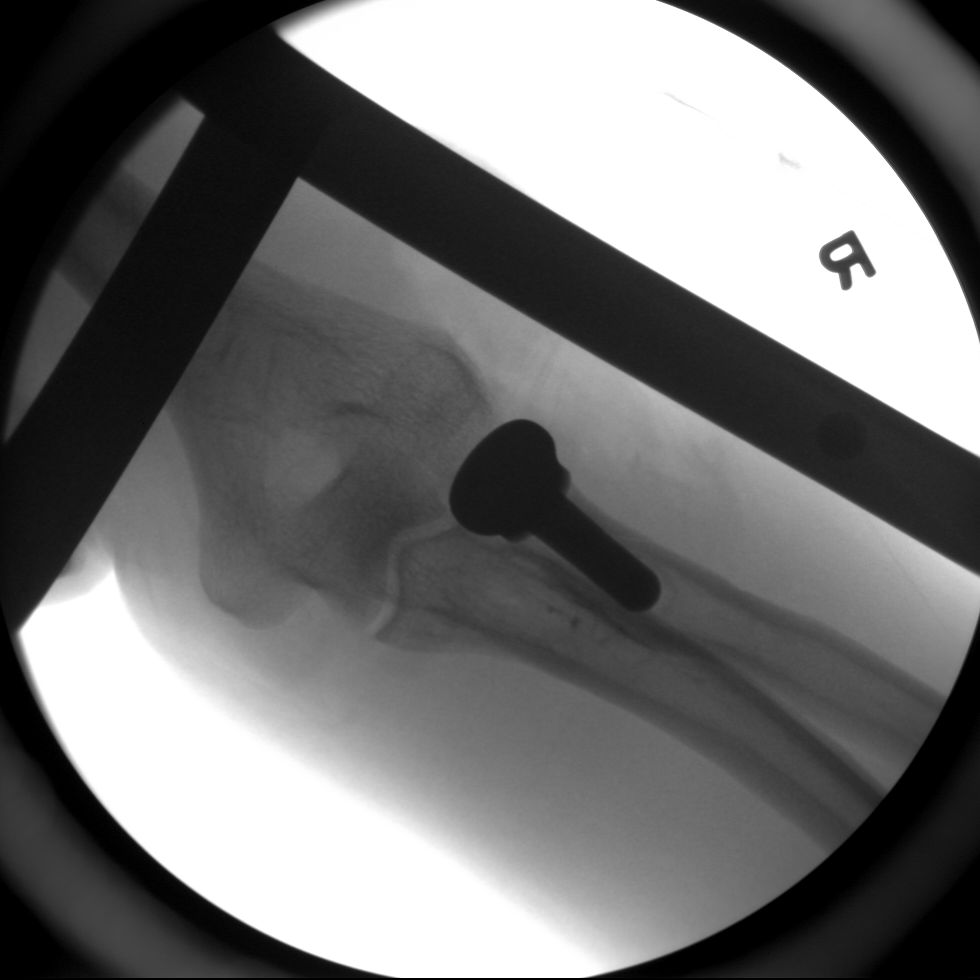

[5 of 5 positions shown; findings below may reference images not displayed]

FINDINGS: Five intraoperative fluoroscopic images were obtained of the right
elbow. These demonstrate the patient be status post arthroplasty of
the proximal right radial head. No dislocation is noted.
IMPRESSION: Fluoroscopic guidance provided during right radial head
arthroplasty.

## 2020-01-26 ENCOUNTER — Telehealth: Payer: Self-pay | Admitting: Family Medicine

## 2020-01-26 NOTE — Telephone Encounter (Signed)
Please set up Cologuard for patient per his request.  He is turning 64 this month.  Please notify patient.  Thanks.

## 2020-01-27 NOTE — Addendum Note (Signed)
Addended by: Josetta Huddle on: 01/27/2020 11:27 AM   Modules accepted: Orders

## 2020-02-10 ENCOUNTER — Telehealth: Payer: Self-pay

## 2020-02-10 NOTE — Telephone Encounter (Signed)
Left message for patient to call office regarding cologuard.  Received fax that cologaurd needs insurance information.  Looked in patient chart and no updated insurance card.

## 2020-02-21 NOTE — Addendum Note (Signed)
Addended by: Josetta Huddle on: 02/21/2020 04:17 PM   Modules accepted: Orders

## 2020-09-01 ENCOUNTER — Other Ambulatory Visit: Payer: Self-pay | Admitting: Family Medicine

## 2020-11-02 ENCOUNTER — Telehealth: Payer: Self-pay | Admitting: Family Medicine

## 2020-11-02 NOTE — Telephone Encounter (Signed)
Patient is due for appt; please call and schedule and send back to me for refill.

## 2020-11-03 NOTE — Telephone Encounter (Signed)
CALLED PT AND VM NOT SET UP

## 2020-11-03 NOTE — Telephone Encounter (Signed)
Please continue to try and reach patient.

## 2021-01-06 NOTE — Telephone Encounter (Signed)
Refill sent.

## 2021-01-06 NOTE — Telephone Encounter (Signed)
Called pt and set him up for 7/6 @820  for labs and 7/11 @9  for cpe

## 2021-02-18 DIAGNOSIS — M79672 Pain in left foot: Secondary | ICD-10-CM | POA: Diagnosis not present

## 2021-02-18 DIAGNOSIS — D2372 Other benign neoplasm of skin of left lower limb, including hip: Secondary | ICD-10-CM | POA: Diagnosis not present

## 2021-02-24 ENCOUNTER — Other Ambulatory Visit (INDEPENDENT_AMBULATORY_CARE_PROVIDER_SITE_OTHER): Payer: PPO

## 2021-02-24 ENCOUNTER — Other Ambulatory Visit: Payer: Self-pay | Admitting: Family Medicine

## 2021-02-24 ENCOUNTER — Other Ambulatory Visit: Payer: Self-pay

## 2021-02-24 DIAGNOSIS — Z125 Encounter for screening for malignant neoplasm of prostate: Secondary | ICD-10-CM

## 2021-02-24 DIAGNOSIS — E785 Hyperlipidemia, unspecified: Secondary | ICD-10-CM

## 2021-02-24 LAB — COMPREHENSIVE METABOLIC PANEL
ALT: 26 U/L (ref 0–53)
AST: 22 U/L (ref 0–37)
Albumin: 4.3 g/dL (ref 3.5–5.2)
Alkaline Phosphatase: 64 U/L (ref 39–117)
BUN: 19 mg/dL (ref 6–23)
CO2: 28 mEq/L (ref 19–32)
Calcium: 9.2 mg/dL (ref 8.4–10.5)
Chloride: 105 mEq/L (ref 96–112)
Creatinine, Ser: 1.18 mg/dL (ref 0.40–1.50)
GFR: 64.51 mL/min (ref >60.00)
Glucose, Bld: 103 mg/dL — ABNORMAL HIGH (ref 70–99)
Potassium: 4.4 mEq/L (ref 3.5–5.1)
Sodium: 139 mEq/L (ref 135–145)
Total Bilirubin: 0.9 mg/dL (ref 0.2–1.2)
Total Protein: 6.6 g/dL (ref 6.0–8.3)

## 2021-02-24 LAB — LIPID PANEL
Cholesterol: 155 mg/dL (ref 0–200)
HDL: 42.7 mg/dL (ref >39.00)
LDL Cholesterol: 94 mg/dL (ref 0–99)
NonHDL: 112.5
Total CHOL/HDL Ratio: 4
Triglycerides: 95 mg/dL (ref 0.0–149.0)
VLDL: 19 mg/dL (ref 0.0–40.0)

## 2021-02-24 LAB — PSA: PSA: 1.73 ng/mL (ref 0.10–4.00)

## 2021-03-01 ENCOUNTER — Encounter: Payer: Self-pay | Admitting: Family Medicine

## 2021-03-01 ENCOUNTER — Other Ambulatory Visit: Payer: Self-pay

## 2021-03-01 ENCOUNTER — Ambulatory Visit (INDEPENDENT_AMBULATORY_CARE_PROVIDER_SITE_OTHER): Payer: PPO | Admitting: Family Medicine

## 2021-03-01 VITALS — BP 134/62 | HR 74 | Temp 98.2°F | Ht 69.0 in | Wt 173.0 lb

## 2021-03-01 DIAGNOSIS — Z0000 Encounter for general adult medical examination without abnormal findings: Secondary | ICD-10-CM | POA: Diagnosis not present

## 2021-03-01 DIAGNOSIS — Z7189 Other specified counseling: Secondary | ICD-10-CM

## 2021-03-01 DIAGNOSIS — M7020 Olecranon bursitis, unspecified elbow: Secondary | ICD-10-CM

## 2021-03-01 DIAGNOSIS — E785 Hyperlipidemia, unspecified: Secondary | ICD-10-CM

## 2021-03-01 DIAGNOSIS — Z125 Encounter for screening for malignant neoplasm of prostate: Secondary | ICD-10-CM

## 2021-03-01 NOTE — Patient Instructions (Addendum)
Check with your insurance to see if they will cover the shingrix and tetanus shots.  You could get a pneumonia-20 shot later on.  We'll call about getting cologuard sent to you.   Take care.  Glad to see you.

## 2021-03-01 NOTE — Progress Notes (Signed)
This visit occurred during the SARS-CoV-2 public health emergency.  Safety protocols were in place, including screening questions prior to the visit, additional usage of staff PPE, and extensive cleaning of exam room while observing appropriate contact time as indicated for disinfecting solutions.  He has been on medicare for > 1 year per patient report.  D/w pt.   I have personally reviewed the Medicare Annual Wellness questionnaire and have noted 1. The patient's medical and social history 2. Their use of alcohol, tobacco or illicit drugs 3. Their current medications and supplements 4. The patient's functional ability including ADL's, fall risks, home safety risks and hearing or visual             impairment. 5. Diet and physical activities 6. Evidence for depression or mood disorders  The patients weight, height, BMI have been recorded in the chart and visual acuity is per eye clinic.  I have made referrals, counseling and provided education to the patient based review of the above and I have provided the pt with a written personalized care plan for preventive services.  Provider list updated- see scanned forms.  Routine anticipatory guidance given to patient.  See health maintenance. The possibility exists that previously documented standard health maintenance information may have been brought forward from a previous encounter into this note.  If needed, that same information has been updated to reflect the current situation based on today's encounter.    Flu due fall 2022 Shingles d/w pt.  PNA d/w pt.  Tetanus d/w pt.  Covid vaccine up to date.   Cologuard ordered 2022 Prostate cancer screening- PSA d/w pt. increased up to 1.7 but still normal. Advance directive-wife designated patient were incapacitated. Cognitive function addressed- see scanned forms- and if abnormal then additional documentation follows.   Shingles and tetanus shot may be cheaper the pharmacy for patient and that  is likely higher yield for patient than pneumonia shot currently.  He can get pneumonia shot later on.  In addition to [facility], follow up visit for the below conditions:  L olecranon bursitis.  Puffy locally.  No redness.  Normal ROM.   Elevated Cholesterol: Using medications without problems: yes Muscle aches: no Diet compliance: yes Exercise: yes Labs d/w pt.    BM frequency changed, now with BM x2 in the AM.  No black or bloody stools.  No abd pain.  He can update me as needed.  He had injection for trigger finger and sx finally got better.    Labs discussed with patient.  PMH and SH reviewed  Meds, vitals, and allergies reviewed.   ROS: Per HPI.  Unless specifically indicated otherwise in HPI, the patient denies:  General: fever. Eyes: acute vision changes ENT: sore throat Cardiovascular: chest pain Respiratory: SOB GI: vomiting GU: dysuria Musculoskeletal: acute back pain Derm: acute rash Neuro: acute motor dysfunction Psych: worsening mood Endocrine: polydipsia Heme: bleeding Allergy: hayfever  GEN: nad, alert and oriented HEENT: ncat NECK: supple w/o LA CV: rrr. PULM: ctab, no inc wob ABD: soft, +bs EXT: no edema SKIN: Well-perfused. Left elbow with normal range of motion but puffiness at the olecranon bursa noted.  No erythema.  Not bruised.  Not tender.

## 2021-03-03 ENCOUNTER — Telehealth: Payer: Self-pay | Admitting: *Deleted

## 2021-03-03 DIAGNOSIS — Z1211 Encounter for screening for malignant neoplasm of colon: Secondary | ICD-10-CM

## 2021-03-03 DIAGNOSIS — M7020 Olecranon bursitis, unspecified elbow: Secondary | ICD-10-CM | POA: Insufficient documentation

## 2021-03-03 NOTE — Assessment & Plan Note (Signed)
Continue pravastatin.  Continue work on diet and exercise. 

## 2021-03-03 NOTE — Assessment & Plan Note (Signed)
Advance directive-wife designated patient were incapacitated. 

## 2021-03-03 NOTE — Assessment & Plan Note (Signed)
Discussed padding and observation.  He can ice as needed.  I would not recommend aspiration at this point given the infection risk with that.  Does not appear infected currently.  He agrees with plan.

## 2021-03-03 NOTE — Assessment & Plan Note (Signed)
Flu due fall 2022 Shingles d/w pt.  PNA d/w pt.  Tetanus d/w pt.  Covid vaccine up to date.   Cologuard ordered 2022 Prostate cancer screening- PSA d/w pt. increased up to 1.7 but still normal. Advance directive-wife designated patient were incapacitated. Cognitive function addressed- see scanned forms- and if abnormal then additional documentation follows.   Shingles and tetanus shot may be cheaper the pharmacy for patient and that is likely higher yield for patient than pneumonia shot currently.  He can get pneumonia shot later on.

## 2021-03-03 NOTE — Telephone Encounter (Signed)
Cologuard ordered as instructed by Dr. Damita Dunnings.

## 2021-03-15 DIAGNOSIS — Z1211 Encounter for screening for malignant neoplasm of colon: Secondary | ICD-10-CM | POA: Diagnosis not present

## 2021-03-19 LAB — COLOGUARD: Cologuard: NEGATIVE

## 2021-05-17 ENCOUNTER — Ambulatory Visit: Payer: PPO | Admitting: Dermatology

## 2021-05-17 ENCOUNTER — Other Ambulatory Visit: Payer: Self-pay

## 2021-05-17 DIAGNOSIS — L578 Other skin changes due to chronic exposure to nonionizing radiation: Secondary | ICD-10-CM

## 2021-05-17 DIAGNOSIS — L820 Inflamed seborrheic keratosis: Secondary | ICD-10-CM | POA: Diagnosis not present

## 2021-05-17 DIAGNOSIS — L821 Other seborrheic keratosis: Secondary | ICD-10-CM | POA: Diagnosis not present

## 2021-05-17 DIAGNOSIS — D485 Neoplasm of uncertain behavior of skin: Secondary | ICD-10-CM

## 2021-05-17 DIAGNOSIS — B079 Viral wart, unspecified: Secondary | ICD-10-CM | POA: Diagnosis not present

## 2021-05-17 NOTE — Patient Instructions (Addendum)
Wound Care Instructions  Cleanse wound gently with soap and water once a day then pat dry with clean gauze. Apply a thing coat of Petrolatum (petroleum jelly, "Vaseline") over the wound (unless you have an allergy to this). We recommend that you use a new, sterile tube of Vaseline. Do not pick or remove scabs. Do not remove the yellow or white "healing tissue" from the base of the wound.  Cover the wound with fresh, clean, nonstick gauze and secure with paper tape. You may use Band-Aids in place of gauze and tape if the would is small enough, but would recommend trimming much of the tape off as there is often too much. Sometimes Band-Aids can irritate the skin.  You should call the office for your biopsy report after 1 week if you have not already been contacted.  If you experience any problems, such as abnormal amounts of bleeding, swelling, significant bruising, significant pain, or evidence of infection, please call the office immediately.  FOR ADULT SURGERY PATIENTS: If you need something for pain relief you may take 1 extra strength Tylenol (acetaminophen) AND 2 Ibuprofen (200mg each) together every 4 hours as needed for pain. (do not take these if you are allergic to them or if you have a reason you should not take them.) Typically, you may only need pain medication for 1 to 3 days.   If you have any questions or concerns for your doctor, please call our main line at [phone] and press option 4 to reach your doctor's medical assistant. If no one answers, please leave a voicemail as directed and we will return your call as soon as possible. Messages left after 4 pm will be answered the following business day.   You may also send us a message via MyChart. We typically respond to MyChart messages within 1-2 business days.  For prescription refills, please ask your pharmacy to contact our office. Our fax number is [phone].  If you have an urgent issue when the clinic is closed that  cannot wait until the next business day, you can page your doctor at the number below.    Please note that while we do our best to be available for urgent issues outside of office hours, we are not available 24/7.   If you have an urgent issue and are unable to reach us, you may choose to seek medical care at your doctor's office, retail clinic, urgent care center, or emergency room.  If you have a medical emergency, please immediately call 911 or go to the emergency department.  Pager Numbers  - Dr. Kowalski: [phone]  - Dr. Moye: [phone]  - Dr. Stewart: [phone]  In the event of inclement weather, please call our main line at [phone] for an update on the status of any delays or closures.  Dermatology Medication Tips: Please keep the boxes that topical medications come in in order to help keep track of the instructions about where and how to use these. Pharmacies typically print the medication instructions only on the boxes and not directly on the medication tubes.   If your medication is too expensive, please contact our office at [phone] option 4 or send us a message through MyChart.   We are unable to tell what your co-pay for medications will be in advance as this is different depending on your insurance coverage. However, we may be able to find a substitute medication at lower cost or fill out paperwork to get insurance to cover a needed   medication.   If a prior authorization is required to get your medication covered by your insurance company, please allow us 1-2 business days to complete this process.  Drug prices often vary depending on where the prescription is filled and some pharmacies may offer cheaper prices.  The website www.goodrx.com contains coupons for medications through different pharmacies. The prices here do not account for what the cost may be with help from insurance (it may be cheaper with your insurance), but the website can give you the  price if you did not use any insurance.  - You can print the associated coupon and take it with your prescription to the pharmacy.  - You may also stop by our office during regular business hours and pick up a GoodRx coupon card.  - If you need your prescription sent electronically to a different pharmacy, notify our office through [facility] MyChart or by phone at [phone] option 4.   

## 2021-05-17 NOTE — Progress Notes (Signed)
New Patient Visit  Subjective  [name] [name] is a 65 y.o. male who presents for the following: lesions (Under the R eye - x 1 year, L calf, and R calf - irregular appearing, symptomatic, patient would like them removed. ).  The following portions of the chart were reviewed this encounter and updated as appropriate:   Tobacco  Allergies  Meds  Problems  Med Hx  Surg Hx  Fam Hx     Review of Systems:  No other skin or systemic complaints except as noted in HPI or Assessment and Plan.  Objective  Well appearing patient in no apparent distress; mood and affect are within normal limits.  A focused examination was performed including the face and lower legs. Relevant physical exam findings are noted in the Assessment and Plan.  R med cheek and nose x 4 (4) Erythematous keratotic or waxy stuck-on papule or plaque.   R mid lat calf 1.2 cm hyperkeratotic papule.  L prox med calf 0.7 cm hyperkeratotic papule.  R med lower eyelid margin x 1 Erythematous keratotic or waxy stuck-on papule or plaque.    Assessment & Plan  Inflamed seborrheic keratosis R med cheek and nose x 4  Destruction of lesion - R med cheek and nose x 4 Complexity: extensive   Destruction method: electrodesiccation and curettage   Informed consent: discussed and consent obtained   Timeout:  patient name, date of birth, surgical site, and procedure verified Procedure prep:  Patient was prepped and draped in usual sterile fashion Prep type:  Isopropyl alcohol Anesthesia: the lesion was anesthetized in a standard fashion   Anesthetic:  1% lidocaine w/ epinephrine 1-100,000 buffered w/ 8.4% NaHCO3 Curettage performed in three different directions: Yes   Electrodesiccation performed over the curetted area: Yes   Hemostasis achieved with:  pressure, aluminum chloride and electrodesiccation Outcome: patient tolerated procedure well with no complications   Post-procedure details: sterile dressing applied  and wound care instructions given   Dressing type: bandage and petrolatum    Neoplasm of uncertain behavior of skin (2) R mid lat calf  Epidermal / dermal shaving  Lesion diameter (cm):  1.2 Informed consent: discussed and consent obtained   Timeout: patient name, date of birth, surgical site, and procedure verified   Procedure prep:  Patient was prepped and draped in usual sterile fashion Prep type:  Isopropyl alcohol Anesthesia: the lesion was anesthetized in a standard fashion   Anesthetic:  1% lidocaine w/ epinephrine 1-100,000 buffered w/ 8.4% NaHCO3 Instrument used: flexible razor blade   Hemostasis achieved with: pressure, aluminum chloride and electrodesiccation   Outcome: patient tolerated procedure well   Post-procedure details: sterile dressing applied and wound care instructions given   Dressing type: bandage and petrolatum    Specimen 1 - Surgical pathology Differential Diagnosis: D48.5 ISK vs wart vs SCC vs other  Check Margins: No  L prox med calf  Epidermal / dermal shaving  Lesion diameter (cm):  0.7 Informed consent: discussed and consent obtained   Timeout: patient name, date of birth, surgical site, and procedure verified   Procedure prep:  Patient was prepped and draped in usual sterile fashion Prep type:  Isopropyl alcohol Anesthesia: the lesion was anesthetized in a standard fashion   Anesthetic:  1% lidocaine w/ epinephrine 1-100,000 buffered w/ 8.4% NaHCO3 Instrument used: flexible razor blade   Hemostasis achieved with: pressure, aluminum chloride and electrodesiccation   Outcome: patient tolerated procedure well   Post-procedure details: sterile dressing applied and  wound care instructions given   Dressing type: bandage and petrolatum    Specimen 2 - Surgical pathology Differential Diagnosis: D48.5 ISK vs wart vs SCC vs other  Check Margins: No  Seborrheic keratosis, inflamed R med lower eyelid margin x 1  Destruction of lesion - R med lower  eyelid margin x 1 Complexity: simple   Destruction method: cryotherapy   Informed consent: discussed and consent obtained   Timeout:  patient name, date of birth, surgical site, and procedure verified Lesion destroyed using liquid nitrogen: Yes   Region frozen until ice ball extended beyond lesion: Yes   Outcome: patient tolerated procedure well with no complications   Post-procedure details: wound care instructions given    Seborrheic Keratoses - Stuck-on, waxy, tan-brown papules and/or plaques  - Benign-appearing - Discussed benign etiology and prognosis. - Observe - Call for any changes  Actinic Damage - chronic, secondary to cumulative UV radiation exposure/sun exposure over time - diffuse scaly erythematous macules with underlying dyspigmentation - Recommend daily broad spectrum sunscreen SPF 30+ to sun-exposed areas, reapply every 2 hours as needed.  - Recommend staying in the shade or wearing long sleeves, sun glasses (UVA+UVB protection) and wide brim hats (4-inch brim around the entire circumference of the hat). - Call for new or changing lesions.  Return in about 2 months (around 07/17/2021) for ISK follow up .  Luther Redo, CMA, am acting as scribe for Sarina Ser, MD .  Documentation: I have reviewed the above documentation for accuracy and completeness, and I agree with the above.  Sarina Ser, MD

## 2021-05-20 ENCOUNTER — Encounter: Payer: Self-pay | Admitting: Dermatology

## 2021-05-21 ENCOUNTER — Telehealth: Payer: Self-pay

## 2021-05-21 NOTE — Telephone Encounter (Signed)
-----   Message from Ralene Bathe, MD sent at 05/20/2021  2:16 PM EDT ----- Diagnosis 1. Skin , right mid lat calf VERRUCA VULGARIS, IRRITATED 2. Skin , left prox med calf VERRUCA VULGARIS, IRRITATED  1&2 - both benign irritated viral wart May recur No further treatment needed at this time

## 2021-05-21 NOTE — Telephone Encounter (Signed)
Unable to leave message

## 2021-05-27 ENCOUNTER — Telehealth: Payer: Self-pay

## 2021-05-27 NOTE — Telephone Encounter (Signed)
-----   Message from Ralene Bathe, MD sent at 05/20/2021  2:16 PM EDT ----- Diagnosis 1. Skin , right mid lat calf VERRUCA VULGARIS, IRRITATED 2. Skin , left prox med calf VERRUCA VULGARIS, IRRITATED  1&2 - both benign irritated viral wart May recur No further treatment needed at this time

## 2021-05-27 NOTE — Telephone Encounter (Signed)
Advised patient of results/hd  

## 2021-07-21 ENCOUNTER — Ambulatory Visit: Payer: PPO | Admitting: Dermatology

## 2021-07-21 ENCOUNTER — Other Ambulatory Visit: Payer: Self-pay

## 2021-07-21 DIAGNOSIS — L821 Other seborrheic keratosis: Secondary | ICD-10-CM | POA: Diagnosis not present

## 2021-07-21 DIAGNOSIS — L820 Inflamed seborrheic keratosis: Secondary | ICD-10-CM

## 2021-07-21 DIAGNOSIS — L578 Other skin changes due to chronic exposure to nonionizing radiation: Secondary | ICD-10-CM | POA: Diagnosis not present

## 2021-07-21 DIAGNOSIS — L814 Other melanin hyperpigmentation: Secondary | ICD-10-CM | POA: Diagnosis not present

## 2021-07-21 DIAGNOSIS — B079 Viral wart, unspecified: Secondary | ICD-10-CM | POA: Diagnosis not present

## 2021-07-21 NOTE — Progress Notes (Signed)
   Follow-Up Visit   Subjective  [name] [name] is a 65 y.o. male who presents for the following: checks spots (Back, left side, no symptoms) and hx of ISKs (Face, resolved with LN2 txt). The patient has spots, moles and lesions to be evaluated, some may be new or changing and the patient has concerns that these could be cancer.   The following portions of the chart were reviewed this encounter and updated as appropriate:   Tobacco  Allergies  Meds  Problems  Med Hx  Surg Hx  Fam Hx     Review of Systems:  No other skin or systemic complaints except as noted in HPI or Assessment and Plan.  Objective  Well appearing patient in no apparent distress; mood and affect are within normal limits.  A focused examination was performed including trunk, face. Relevant physical exam findings are noted in the Assessment and Plan.  back x 20, Total = 20 (20) Erythematous keratotic or waxy stuck-on papule or plaque.   R post auricular x 1 Verrucous papules -- Discussed viral etiology and contagion.    Assessment & Plan  Inflamed seborrheic keratosis back x 20, Total = 20  Destruction of lesion - back x 20, Total = 20 Complexity: simple   Destruction method: cryotherapy   Informed consent: discussed and consent obtained   Timeout:  patient name, date of birth, surgical site, and procedure verified Lesion destroyed using liquid nitrogen: Yes   Region frozen until ice ball extended beyond lesion: Yes   Outcome: patient tolerated procedure well with no complications   Post-procedure details: wound care instructions given    Viral warts, unspecified type R post auricular x 1  Discussed viral etiology and risk of spread.  Discussed multiple treatments may be required to clear warts.  Discussed possible post-treatment dyspigmentation and risk of recurrence.  Destruction of lesion - R post auricular x 1 Complexity: simple   Destruction method: cryotherapy   Informed consent:  discussed and consent obtained   Timeout:  patient name, date of birth, surgical site, and procedure verified Lesion destroyed using liquid nitrogen: Yes   Region frozen until ice ball extended beyond lesion: Yes   Outcome: patient tolerated procedure well with no complications   Post-procedure details: wound care instructions given    Seborrheic Keratoses - Stuck-on, waxy, tan-brown papules and/or plaques  - Benign-appearing - Discussed benign etiology and prognosis. - Observe - Call for any changes  Lentigines - Scattered tan macules - Due to sun exposure - Benign-appering, observe - Recommend daily broad spectrum sunscreen SPF 30+ to sun-exposed areas, reapply every 2 hours as needed. - Call for any changes  Actinic Damage - chronic, secondary to cumulative UV radiation exposure/sun exposure over time - diffuse scaly erythematous macules with underlying dyspigmentation - Recommend daily broad spectrum sunscreen SPF 30+ to sun-exposed areas, reapply every 2 hours as needed.  - Recommend staying in the shade or wearing long sleeves, sun glasses (UVA+UVB protection) and wide brim hats (4-inch brim around the entire circumference of the hat). - Call for new or changing lesions.   Return if symptoms worsen or fail to improve.  I, Othelia Pulling, RMA, am acting as scribe for Sarina Ser, MD . Documentation: I have reviewed the above documentation for accuracy and completeness, and I agree with the above.  Sarina Ser, MD

## 2021-07-21 NOTE — Patient Instructions (Addendum)
If You Need Anything After Your Visit  If you have any questions or concerns for your doctor, please call our main line at [phone] and press option 4 to reach your doctor's medical assistant. If no one answers, please leave a voicemail as directed and we will return your call as soon as possible. Messages left after 4 pm will be answered the following business day.   You may also send us a message via MyChart. We typically respond to MyChart messages within 1-2 business days.  For prescription refills, please ask your pharmacy to contact our office. Our fax number is [phone].  If you have an urgent issue when the clinic is closed that cannot wait until the next business day, you can page your doctor at the number below.    Please note that while we do our best to be available for urgent issues outside of office hours, we are not available 24/7.   If you have an urgent issue and are unable to reach us, you may choose to seek medical care at your doctor's office, retail clinic, urgent care center, or emergency room.  If you have a medical emergency, please immediately call 911 or go to the emergency department.  Pager Numbers  - Dr. Kowalski: [phone]  - Dr. Moye: [phone]  - Dr. Stewart: [phone]  In the event of inclement weather, please call our main line at [phone] for an update on the status of any delays or closures.  Dermatology Medication Tips: Please keep the boxes that topical medications come in in order to help keep track of the instructions about where and how to use these. Pharmacies typically print the medication instructions only on the boxes and not directly on the medication tubes.   If your medication is too expensive, please contact our office at [phone] option 4 or send us a message through MyChart.   We are unable to tell what your co-pay for medications will be in advance as this is different depending on your insurance coverage.  However, we may be able to find a substitute medication at lower cost or fill out paperwork to get insurance to cover a needed medication.   If a prior authorization is required to get your medication covered by your insurance company, please allow us 1-2 business days to complete this process.  Drug prices often vary depending on where the prescription is filled and some pharmacies may offer cheaper prices.  The website www.goodrx.com contains coupons for medications through different pharmacies. The prices here do not account for what the cost may be with help from insurance (it may be cheaper with your insurance), but the website can give you the price if you did not use any insurance.  - You can print the associated coupon and take it with your prescription to the pharmacy.  - You may also stop by our office during regular business hours and pick up a GoodRx coupon card.  - If you need your prescription sent electronically to a different pharmacy, notify our office through [place] MyChart or by phone at [phone] option 4.     Si Usted Necesita Algo Despus de Su Visita  Tambin puede enviarnos un mensaje a travs de MyChart. Por lo general respondemos a los mensajes de MyChart en el transcurso de 1 a 2 das hbiles.  Para renovar recetas, por favor pida a su farmacia que se ponga en contacto con nuestra oficina. Nuestro nmero de fax es el [phone].  Si tiene   un asunto urgente cuando la clnica est cerrada y que no puede esperar hasta el siguiente da hbil, puede llamar/localizar a su doctor(a) al nmero que aparece a continuacin.   Por favor, tenga en cuenta que aunque hacemos todo lo posible para estar disponibles para asuntos urgentes fuera del horario de oficina, no estamos disponibles las 24 horas del da, los 7 das de la semana.   Si tiene un problema urgente y no puede comunicarse con nosotros, puede optar por buscar atencin mdica  en el consultorio de su  doctor(a), en una clnica privada, en un centro de atencin urgente o en una sala de emergencias.  Si tiene una emergencia mdica, por favor llame inmediatamente al 911 o vaya a la sala de emergencias.  Nmeros de bper  - Dr. Kowalski: [phone]  - Dra. Moye: [phone]  - Dra. Stewart: [phone]  En caso de inclemencias del tiempo, por favor llame a nuestra lnea principal al [phone] para una actualizacin sobre el estado de cualquier retraso o cierre.  Consejos para la medicacin en dermatologa: Por favor, guarde las cajas en las que vienen los medicamentos de uso tpico para ayudarle a seguir las instrucciones sobre dnde y cmo usarlos. Las farmacias generalmente imprimen las instrucciones del medicamento slo en las cajas y no directamente en los tubos del medicamento.   Si su medicamento es muy caro, por favor, pngase en contacto con nuestra oficina llamando al [phone] y presione la opcin 4 o envenos un mensaje a travs de MyChart.   No podemos decirle cul ser su copago por los medicamentos por adelantado ya que esto es diferente dependiendo de la cobertura de su seguro. Sin embargo, es posible que podamos encontrar un medicamento sustituto a menor costo o llenar un formulario para que el seguro cubra el medicamento que se considera necesario.   Si se requiere una autorizacin previa para que su compaa de seguros cubra su medicamento, por favor permtanos de 1 a 2 das hbiles para completar este proceso.  Los precios de los medicamentos varan con frecuencia dependiendo del lugar de dnde se surte la receta y alguna farmacias pueden ofrecer precios ms baratos.  El sitio web www.goodrx.com tiene cupones para medicamentos de diferentes farmacias. Los precios aqu no tienen en cuenta lo que podra costar con la ayuda del seguro (puede ser ms barato con su seguro), pero el sitio web puede darle el precio si no utiliz ningn seguro.  - Puede imprimir el cupn  correspondiente y llevarlo con su receta a la farmacia.  - Tambin puede pasar por nuestra oficina durante el horario de atencin regular y recoger una tarjeta de cupones de GoodRx.  - Si necesita que su receta se enve electrnicamente a una farmacia diferente, informe a nuestra oficina a travs de MyChart de [place] o por telfono llamando al [phone] y presione la opcin 4.   Cryotherapy Aftercare  Wash gently with soap and water everyday.   Apply Vaseline and Band-Aid daily until healed.  

## 2021-07-29 ENCOUNTER — Encounter: Payer: Self-pay | Admitting: Dermatology

## 2021-09-10 ENCOUNTER — Encounter: Payer: Self-pay | Admitting: Emergency Medicine

## 2021-09-10 ENCOUNTER — Other Ambulatory Visit: Payer: Self-pay

## 2021-09-10 ENCOUNTER — Ambulatory Visit
Admission: EM | Admit: 2021-09-10 | Discharge: 2021-09-10 | Disposition: A | Discharge status: Home / Self Care | Payer: PPO | Attending: Internal Medicine | Admitting: Internal Medicine

## 2021-09-10 DIAGNOSIS — U071 COVID-19: Secondary | ICD-10-CM

## 2021-09-10 MED ORDER — NIRMATRELVIR/RITONAVIR (PAXLOVID)TABLET: 3.0000 | ORAL_TABLET | Freq: Two times a day (BID) | ORAL | 0 refills | Status: AC

## 2021-09-10 NOTE — ED Triage Notes (Signed)
Pt here with cough, sore throat, and fatigue x 5 days. Tested positive for COVID yesterday. Feels he is getting much better, but would like to be checked out. Pt has had the initial COVID vaccine series but no boosters.

## 2021-09-10 NOTE — Discharge Instructions (Addendum)
Maintain adequate hydration Stop taking your cholesterol medication for 10 days starting on the day you started taking Paxlovid Please take Tylenol/Motrin as needed for pain and/or fever If your symptoms worsen please return to urgent care.

## 2021-09-10 NOTE — ED Provider Notes (Signed)
[name] [name]    CSN: [id number] Arrival date & time: 09/10/21  0813      History   Chief Complaint Chief Complaint  Patient presents with   Cough   Sore Throat   Fatigue    HPI [name] [name] is a 66 y.o. male to the urgent care with nonproductive cough, soreness and fatigue which started 4 to 5 days ago.  Patient felt slightly better on the second and third ibuprofen as started feeling worse over the past couple of days.  No shortness of breath.  Mild chest tightness.  Patient denies any tobacco use.  No fever or chills.  Patient has had primary series for COVID-19 infection.  No nausea, vomiting or diarrhea.  Patient tested positive for COVID-19 yesterday using home COVID test.   HPI  Past Medical History:  Diagnosis Date   Hyperlipidemia    Impotence of organic origin    Nephrolithiasis    Nonspecific abnormal results of liver function study    Palpitations    secondary to PVCs   PONV (postoperative nausea and vomiting)    Trigger finger (acquired)    Right, middle    Patient Active Problem List   Diagnosis Date Noted   Olecranon bursitis 03/03/2021   Medicare annual wellness visit, initial 03/21/2018   Knee pain 03/21/2018   Advance care planning 08/13/2014   Radicular pain in left arm 04/03/2014   Situational anxiety 11/06/2011   TRIGGER FINGER, RIGHT MIDDLE 07/26/2010   ORGANIC IMPOTENCE 03/15/2010   PVC (premature ventricular contraction) 08/21/2007   Hyperlipidemia 02/21/2007   CALCULUS, KIDNEY 02/21/2007   PALPITATIONS 01/13/2007    Past Surgical History:  Procedure Laterality Date   CT ABD W & PELVIS WO CM  12/21/2006   2MM Stone at [employer], Di[facility]   EXTRACORPOREAL SHOCK WAVE LITHOTRIPSY Left 12/08/2016   Procedure: EXTRACORPOREAL SHOCK WAVE LITHOTRIPSY (ESWL);  Surgeon: Royston Cowper, MD;  Location: [facility];  Service: Urology;  Laterality: Left;   LAMINECTOMY  08/1989   L5   LAMINECTOMY  08/2004   S1   RADIAL HEAD ARTHROPLASTY  Right 06/14/2019   Procedure: RIGHT ELBOW RADIAL HEAD REPLACEMENT;  Surgeon: Milly Jakob, MD;  Location: [place];  Service: Orthopedics;  Laterality: Right;       Home Medications    Prior to Admission medications   Medication Sig Start Date End Date Taking? Authorizing Provider  nirmatrelvir/ritonavir EUA (PAXLOVID) 20 x 150 MG & 10 x 100MG  TABS Take 3 tablets by mouth 2 (two) times daily for 5 days. Patient GFR is greater than 60 Take nirmatrelvir (150 mg) two tablets twice daily for 5 days and ritonavir (100 mg) one tablet twice daily for 5 days. 09/10/21 09/15/21 Yes Alfard Cochrane, Myrene Galas, MD  Glucosamine-Chondroit-Vit C-Mn (GLUCOSAMINE 1500 COMPLEX) CAPS Take 1 capsule by mouth daily.      [provider]  Omega-3 Fatty Acids (FISH OIL) 1000 MG CAPS Take 1 capsule by mouth 2 (two) times daily.     [provider]  pravastatin (PRAVACHOL) 40 MG tablet TAKE 1 TABLET BY MOUTH EVERY NIGHT AT BEDTIME 01/06/21   Tonia Ghent, MD    Family History Family History  Problem Relation Age of Onset   Diabetes Mother    Hypertension Mother    Heart disease Neg Hx    Cancer Neg Hx    Prostate cancer Neg Hx    Colon cancer Neg Hx     Social History Social History  Tobacco Use   Smoking status: Never   Smokeless tobacco: Never  Vaping Use   Vaping Use: Never used  Substance Use Topics   Alcohol use: No   Drug use: No     Allergies   Patient has no known allergies.   Review of Systems Review of Systems  Constitutional: Negative.   HENT: Negative.    Respiratory:  Positive for cough. Negative for shortness of breath and wheezing.   Cardiovascular: Negative.   Gastrointestinal: Negative.   Neurological:  Negative for dizziness, light-headedness and headaches.    Physical Exam Triage Vital Signs ED Triage Vitals  Enc Vitals Group     BP 09/10/21 0825 (!) 160/89     Pulse Rate 09/10/21 0825 71     Resp 09/10/21 0825 18     Temp  09/10/21 0825 98.9 F (37.2 C)     Temp src --      SpO2 09/10/21 0825 94 %     Weight --      Height --      Head Circumference --      Peak Flow --      Pain Score 09/10/21 0828 0     Pain Loc --      Pain Edu? --      Excl. in [place]? --    No data found.  Updated Vital Signs BP (!) 160/89    Pulse 71    Temp 98.9 F (37.2 C)    Resp 18    SpO2 94%   Visual Acuity Right Eye Distance:   Left Eye Distance:   Bilateral Distance:    Right Eye Near:   Left Eye Near:    Bilateral Near:     Physical Exam Vitals and nursing note reviewed.  Constitutional:      General: He is not in acute distress.    Appearance: He is not ill-appearing.  HENT:     Right Ear: Tympanic membrane normal.     Left Ear: Tympanic membrane normal.  Cardiovascular:     Rate and Rhythm: Normal rate and regular rhythm.  Pulmonary:     Effort: Pulmonary effort is normal.     Breath sounds: Normal breath sounds.  Abdominal:     General: Bowel sounds are normal.     Palpations: Abdomen is soft.  Neurological:     Mental Status: He is alert.     UC Treatments / Results  Labs (all labs ordered are listed, but only abnormal results are displayed) Labs Reviewed - No data to display  EKG   Radiology No results found.  Procedures Procedures (including critical care time)  Medications Ordered in UC Medications - No data to display  Initial Impression / Assessment and Plan / UC Course  I have reviewed the triage vital signs and the nursing notes.  Pertinent labs & imaging results that were available during my care of the patient were reviewed by me and considered in my medical decision making (see chart for details).     1.  COVID-19 infection: Paxlovid for 5 days Maintain adequate hydration Tylenol/Motrin as needed for fever and/or body aches Please return to urgent care if symptoms worsen.  Final Clinical Impressions(s) / UC Diagnoses   Final diagnoses:  [id number] virus infection      Discharge Instructions      Maintain adequate hydration Stop taking your cholesterol medication for 10 days starting on the day you started taking Paxlovid Please take Tylenol/Motrin as  needed for pain and/or fever If your symptoms worsen please return to urgent care.   ED Prescriptions     Medication Sig Dispense Auth. Provider   nirmatrelvir/ritonavir EUA (PAXLOVID) 20 x 150 MG & 10 x 100MG  TABS Take 3 tablets by mouth 2 (two) times daily for 5 days. Patient GFR is greater than 60 Take nirmatrelvir (150 mg) two tablets twice daily for 5 days and ritonavir (100 mg) one tablet twice daily for 5 days. 30 tablet America Sandall, Myrene Galas, MD      PDMP not reviewed this encounter.   Chase Picket, MD 09/10/21 [phone]

## 2021-12-07 ENCOUNTER — Ambulatory Visit
Admission: EM | Admit: 2021-12-07 | Discharge: 2021-12-07 | Disposition: A | Discharge status: Home / Self Care | Payer: PPO | Attending: Emergency Medicine | Admitting: Emergency Medicine

## 2021-12-07 ENCOUNTER — Encounter: Payer: Self-pay | Admitting: Emergency Medicine

## 2021-12-07 DIAGNOSIS — J209 Acute bronchitis, unspecified: Secondary | ICD-10-CM

## 2021-12-07 MED ORDER — BENZONATATE 100 MG PO CAPS: 100.0000 mg | ORAL_CAPSULE | Freq: Three times a day (TID) | ORAL | 0 refills | Status: DC | PRN

## 2021-12-07 MED ORDER — AZITHROMYCIN 250 MG PO TABS: 250.0000 mg | ORAL_TABLET | Freq: Every day | ORAL | 0 refills | Status: DC

## 2021-12-07 NOTE — ED Provider Notes (Signed)
?[facility] ? ? ? ?CSN: [id number] ?Arrival date & time: 12/07/21  7829 ? ? ?  ? ?History   ?Chief Complaint ?Chief Complaint  ?Patient presents with  ? Cough  ? ? ?HPI ?[name] [name] is a 66 y.o. male.  Patient presents with 1 week history of cough and congestion.  He denies fever, chills, ear pain, sore throat, chest pain, shortness of breath, or other symptoms.  No OTC medications taken today.  His medical history includes hyperlipidemia and kidney stones. ? ?The history is provided by the patient and medical records.  ? ?Past Medical History:  ?Diagnosis Date  ? Hyperlipidemia   ? Impotence of organic origin   ? Nephrolithiasis   ? Nonspecific abnormal results of liver function study   ? Palpitations   ? secondary to PVCs  ? PONV (postoperative nausea and vomiting)   ? Trigger finger (acquired)   ? Right, middle  ? ? ?Patient Active Problem List  ? Diagnosis Date Noted  ? Olecranon bursitis 03/03/2021  ? Medicare annual wellness visit, initial 03/21/2018  ? Knee pain 03/21/2018  ? Advance care planning 08/13/2014  ? Radicular pain in left arm 04/03/2014  ? Situational anxiety 11/06/2011  ? TRIGGER FINGER, RIGHT MIDDLE 07/26/2010  ? ORGANIC IMPOTENCE 03/15/2010  ? PVC (premature ventricular contraction) 08/21/2007  ? Hyperlipidemia 02/21/2007  ? CALCULUS, KIDNEY 02/21/2007  ? PALPITATIONS 01/13/2007  ? ? ?Past Surgical History:  ?Procedure Laterality Date  ? CT ABD W & PELVIS WO CM  12/21/2006  ? 2MM Stone at [employer], [place]  ? EXTRACORPOREAL SHOCK WAVE LITHOTRIPSY Left 12/08/2016  ? Procedure: EXTRACORPOREAL SHOCK WAVE LITHOTRIPSY (ESWL);  Surgeon: Royston Cowper, MD;  Location: [facility];  Service: Urology;  Laterality: Left;  ? LAMINECTOMY  08/1989  ? L5  ? LAMINECTOMY  08/2004  ? S1  ? RADIAL HEAD ARTHROPLASTY Right 06/14/2019  ? Procedure: RIGHT ELBOW RADIAL HEAD REPLACEMENT;  Surgeon: Milly Jakob, MD;  Location: [place];  Service: Orthopedics;  Laterality: Right;   ? ? ? ? ? ?Home Medications   ? ?Prior to Admission medications   ?Medication Sig Start Date End Date Taking? Authorizing Provider  ?azithromycin (ZITHROMAX) 250 MG tablet Take 1 tablet (250 mg total) by mouth daily. Take first 2 tablets together, then 1 every day until finished. 12/07/21  Yes Sharion Balloon, NP  ?benzonatate (TESSALON) 100 MG capsule Take 1 capsule (100 mg total) by mouth 3 (three) times daily as needed for cough. 12/07/21  Yes Sharion Balloon, NP  ?Glucosamine-Chondroit-Vit C-Mn (GLUCOSAMINE 1500 COMPLEX) CAPS Take 1 capsule by mouth daily.      [provider]  ?Omega-3 Fatty Acids (FISH OIL) 1000 MG CAPS Take 1 capsule by mouth 2 (two) times daily.     [provider]  ?pravastatin (PRAVACHOL) 40 MG tablet TAKE 1 TABLET BY MOUTH EVERY NIGHT AT BEDTIME 01/06/21   Tonia Ghent, MD  ? ? ?Family History ?Family History  ?Problem Relation Age of Onset  ? Diabetes Mother   ? Hypertension Mother   ? Heart disease Neg Hx   ? Cancer Neg Hx   ? Prostate cancer Neg Hx   ? Colon cancer Neg Hx   ? ? ?Social History ?Social History  ? ?Tobacco Use  ? Smoking status: Never  ? Smokeless tobacco: Never  ?Vaping Use  ? Vaping Use: Never used  ?Substance Use Topics  ? Alcohol use: No  ? Drug use: No  ? ? ? ?  Allergies   ?Patient has no known allergies. ? ? ?Review of Systems ?Review of Systems  ?Constitutional:  Negative for chills and fever.  ?HENT:  Positive for congestion. Negative for ear pain and sore throat.   ?Respiratory:  Positive for cough. Negative for shortness of breath.   ?Cardiovascular:  Negative for chest pain and palpitations.  ?Gastrointestinal:  Negative for diarrhea and vomiting.  ?Skin:  Negative for color change and rash.  ?All other systems reviewed and are negative. ? ? ?Physical Exam ?Triage Vital Signs ?ED Triage Vitals  ?Enc Vitals Group  ?   BP   ?   Pulse   ?   Resp   ?   Temp   ?   Temp src   ?   SpO2   ?   Weight   ?   Height   ?   Head Circumference   ?   Peak Flow    ?   Pain Score   ?   Pain Loc   ?   Pain Edu?   ?   Excl. in [place]?   ? ?No data found. ? ?Updated Vital Signs ?BP (!) 145/87   Pulse 68   Temp 98 ?F (36.7 ?C)   Resp 20   SpO2 96%  ? ?Visual Acuity ?Right Eye Distance:   ?Left Eye Distance:   ?Bilateral Distance:   ? ?Right Eye Near:   ?Left Eye Near:    ?Bilateral Near:    ? ?Physical Exam ?Vitals and nursing note reviewed.  ?Constitutional:   ?   General: He is not in acute distress. ?   Appearance: Normal appearance. He is well-developed. He is not ill-appearing.  ?HENT:  ?   Right Ear: Tympanic membrane normal.  ?   Left Ear: Tympanic membrane normal.  ?   Nose: Nose normal.  ?   Mouth/Throat:  ?   Mouth: Mucous membranes are moist.  ?   Pharynx: Oropharynx is clear.  ?Cardiovascular:  ?   Rate and Rhythm: Normal rate and regular rhythm.  ?   Heart sounds: Normal heart sounds.  ?Pulmonary:  ?   Effort: Pulmonary effort is normal. No respiratory distress.  ?   Breath sounds: Normal breath sounds.  ?Musculoskeletal:  ?   Cervical back: Neck supple.  ?Skin: ?   General: Skin is warm and dry.  ?Neurological:  ?   Mental Status: He is alert.  ?Psychiatric:     ?   Mood and Affect: Mood normal.     ?   Behavior: Behavior normal.  ? ? ? ?UC Treatments / Results  ?Labs ?(all labs ordered are listed, but only abnormal results are displayed) ?Labs Reviewed - No data to display ? ?EKG ? ? ?Radiology ?No results found. ? ?Procedures ?Procedures (including critical care time) ? ?Medications Ordered in UC ?Medications - No data to display ? ?Initial Impression / Assessment and Plan / UC Course  ?I have reviewed the triage vital signs and the nursing notes. ? ?Pertinent labs & imaging results that were available during my care of the patient were reviewed by me and considered in my medical decision making (see chart for details). ? ?Acute bronchitis.  Treating with Zithromax and Tessalon Perles.  Education provided on bronchitis.  Instructed patient to follow-up with his  PCP if his symptoms are not improving.  He agrees to plan of care. ? ? ?Final Clinical Impressions(s) / UC Diagnoses  ? ?Final diagnoses:  ?Acute  bronchitis, unspecified organism  ? ? ? ?Discharge Instructions   ? ?  ?Take the Zithromax and Tessalon Perles as directed.  Follow up with your primary care provider if your symptoms are not improving.   ? ? ? ? ? ?ED Prescriptions   ? ? Medication Sig Dispense Auth. Provider  ? benzonatate (TESSALON) 100 MG capsule Take 1 capsule (100 mg total) by mouth 3 (three) times daily as needed for cough. 21 capsule Sharion Balloon, NP  ? azithromycin (ZITHROMAX) 250 MG tablet Take 1 tablet (250 mg total) by mouth daily. Take first 2 tablets together, then 1 every day until finished. 6 tablet Sharion Balloon, NP  ? ?  ? ?PDMP not reviewed this encounter. ?  ?Sharion Balloon, NP ?12/07/21 [phone] ? ?

## 2021-12-07 NOTE — Discharge Instructions (Addendum)
Take the Zithromax and Tessalon Perles as directed.    Follow up with your primary care provider if your symptoms are not improving.     

## 2021-12-07 NOTE — ED Triage Notes (Signed)
Pt here with a wet, but non-productive cough x 1 week. COVID in January. ?

## 2022-01-11 ENCOUNTER — Other Ambulatory Visit: Payer: Self-pay | Admitting: Family Medicine

## 2022-03-02 ENCOUNTER — Ambulatory Visit (INDEPENDENT_AMBULATORY_CARE_PROVIDER_SITE_OTHER): Payer: PPO

## 2022-03-02 VITALS — Ht 69.0 in | Wt 168.0 lb

## 2022-03-02 DIAGNOSIS — Z0000 Encounter for general adult medical examination without abnormal findings: Secondary | ICD-10-CM

## 2022-03-02 NOTE — Progress Notes (Signed)
Subjective:   [name] [name] is a 66 y.o. male who presents for Medicare Annual/Subsequent preventive examination.  Review of Systems    Virtual Visit via Telephone Note  I connected with  [name] [name] on 03/02/22 at  1:30 PM EDT by telephone and verified that I am speaking with the correct person using two identifiers.  Location: Patient: Home Provider: Office Persons participating in the virtual visit: patient/Nurse Health Advisor   I discussed the limitations, risks, security and privacy concerns of performing an evaluation and management service by telephone and the availability of in person appointments. The patient expressed understanding and agreed to proceed.  Interactive audio and video telecommunications were attempted between this nurse and patient, however failed, due to patient having technical difficulties OR patient did not have access to video capability.  We continued and completed visit with audio only.  Some vital signs may be absent or patient reported.   Criselda Peaches, LPN  Cardiac Risk Factors include: advanced age (>61mn, >>69women);male gender     Objective:    Today's Vitals   03/02/22 1337  Weight: 168 lb (76.2 kg)  Height: '5\' 9"'$  (1.753 m)   Body mass index is 24.81 kg/m.     03/02/2022    1:44 PM 06/14/2019   11:33 AM 06/10/2019   10:19 AM 06/05/2019    9:55 AM 12/08/2016    6:28 AM  Advanced Directives  Does Patient Have a Medical Advance Directive? No No No No No  Would patient like information on creating a medical advance directive? No - Patient declined No - Patient declined   No - Patient declined    Current Medications (verified) Outpatient Encounter Medications as of 03/02/2022  Medication Sig   azithromycin (ZITHROMAX) 250 MG tablet Take 1 tablet (250 mg total) by mouth daily. Take first 2 tablets together, then 1 every day until finished.   benzonatate (TESSALON) 100 MG capsule Take 1 capsule (100 mg total) by mouth 3  (three) times daily as needed for cough.   Glucosamine-Chondroit-Vit C-Mn (GLUCOSAMINE 1500 COMPLEX) CAPS Take 1 capsule by mouth daily.     Omega-3 Fatty Acids (FISH OIL) 1000 MG CAPS Take 1 capsule by mouth 2 (two) times daily.    pravastatin (PRAVACHOL) 40 MG tablet TAKE 1 TABLET BY MOUTH EVERY NIGHT AT BEDTIME   No facility-administered encounter medications on file as of 03/02/2022.    Allergies (verified) Patient has no known allergies.   History: Past Medical History:  Diagnosis Date   Hyperlipidemia    Impotence of organic origin    Nephrolithiasis    Nonspecific abnormal results of liver function study    Palpitations    secondary to PVCs   PONV (postoperative nausea and vomiting)    Trigger finger (acquired)    Right, middle   Past Surgical History:  Procedure Laterality Date   CT ABD W & PELVIS WO CM  12/21/2006   [facility]MM [facility]   EXTRACORPOREAL SHOCK WAVE LITHOTRIPSY Left 12/08/2016   Procedure: EXTRACORPOREAL SHOCK WAVE LITHOTRIPSY (ESWL);  Surgeon: MRoyston Cowper MD;  Location: [facility] OR[facility];  Service: Urology;  Laterality: Left;   LAMINECTOMY  08/1989   L5   LAMINECTOMY  08/2004   S1   RADIAL HEAD ARTHROPLASTY Right 06/14/2019   Procedure: RIGHT ELBOW RADIAL HEAD REPLACEMENT;  Surgeon: TMilly Jakob MD;  Location: [facility]  Service: Orthopedics;  Laterality: Right;   Family History  Problem Relation Age of  Onset   Diabetes Mother    Hypertension Mother    Heart disease Neg Hx    Cancer Neg Hx    Prostate cancer Neg Hx    Colon cancer Neg Hx    Social History   Socioeconomic History   Marital status: Married    Spouse name: Not on file   Number of children: 1   Years of education: Not on file   Highest education level: Not on file  Occupational History   Occupation: [employer]  Tobacco Use   Smoking status: Never   Smokeless tobacco: Never  Vaping Use   Vaping Use: Never used  Substance and Sexual Activity    Alcohol use: No   Drug use: No   Sexual activity: Not on file  Other Topics Concern   Not on file  Social History Narrative   Lives with wife, married 1993   1 step-son   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, does commercial tent work   Social Determinants of Radio broadcast assistant Strain: [place]  (03/02/2022)   Overall Financial Resource Strain (CARDIA)    Difficulty of Paying Living Expenses: Not hard at all  Food Insecurity: No Food Insecurity (03/02/2022)   Hunger Vital Sign    Worried About Running Out of Food in the Last Year: Never true    [place] in the Last Year: Never true  Transportation Needs: No Transportation Needs (03/02/2022)   PRAPARE - Hydrologist (Medical): No    Lack of Transportation (Non-Medical): No  Physical Activity: Inactive (03/02/2022)   Exercise Vital Sign    Days of Exercise per Week: 0 days    Minutes of Exercise per Session: 0 min  Stress: No Stress Concern Present (03/02/2022)   Wisdom    Feeling of Stress : Not at all  Social Connections: [place] (03/02/2022)   Social Connection and Isolation Panel [NHANES]    Frequency of Communication with Friends and Family: More than three times a week    Frequency of Social Gatherings with Friends and Family: More than three times a week    Attends Religious Services: More than 4 times per year    Active Member of [employer] or Organizations: Yes    Attends Music therapist: More than 4 times per year    Marital Status: Married    Tobacco Counseling Counseling given: Not Answered   Clinical Intake:  Pre-visit preparation completed: No Diabetic?  No  Interpreter Needed?: No Activities of Daily Living    03/02/2022    1:42 PM  In your present state of health, do you have any difficulty performing the following activities:  Hearing? 0  Vision? 0  Difficulty  concentrating or making decisions? 0  Walking or climbing stairs? 0  Dressing or bathing? 0  Doing errands, shopping? 0  Preparing Food and eating ? N  Using the Toilet? N  In the past six months, have you accidently leaked urine? N  Do you have problems with loss of bowel control? N  Managing your Medications? N  Managing your Finances? N  Housekeeping or managing your Housekeeping? N    Patient Care Team: Tonia Ghent, MD as PCP - General  Indicate any recent Medical Services you may have received from other than [facility] providers in the past year (date may be approximate).     Assessment:   This  is a routine wellness examination for [place].  Hearing/Vision screen Hearing Screening - Comments:: No difficulty hearing  Vision Screening - Comments:: No vision difficulty.  Dietary issues and exercise activities discussed: Exercise limited by: None identified   Goals Addressed               This Visit's Progress     Stay healthy (pt-stated)         Depression Screen    03/02/2022    1:41 PM 03/02/2021    9:05 AM 03/01/2021    8:57 AM 03/20/2018    9:37 AM 09/13/2016    9:54 AM  PHQ 2/9 Scores  PHQ - 2 Score 0 0 0 0 2  PHQ- 9 Score  0 0  3    Fall Risk    03/02/2022    1:43 PM 03/01/2021    8:57 AM  Fall Risk   Falls in the past year? 0 1  Number falls in past yr: 0 0  Injury with Fall? 0 1  Risk for fall due to : No Fall Risks History of fall(s)    FALL RISK PREVENTION PERTAINING TO THE HOME:  Any stairs in or around the home? Yes  If so, are there any without handrails? No  Home free of loose throw rugs in walkways, pet beds, electrical cords, etc? Yes  Adequate lighting in your home to reduce risk of falls? Yes   ASSISTIVE DEVICES UTILIZED TO PREVENT FALLS:  Life alert? No  Use of a cane, walker or w/c? No  Grab bars in the bathroom? No  Shower chair or bench in shower? Yes  Elevated toilet seat or a handicapped toilet? No   TIMED UP AND  GO:  Was the test performed? No . Audio Visit  Cognitive Function:        03/02/2022    1:44 PM  6CIT Screen  What Year? 0 points  What month? 0 points  What time? 0 points  Count back from 20 0 points  Months in reverse 0 points  Repeat phrase 0 points  Total Score 0 points    Immunizations Immunization History  Administered Date(s) Administered   Hep A / Hep B 05/29/2014, 07/29/2014   Influenza Whole 07/26/2010   Influenza,inj,Quad PF,6+ Mos 08/12/2014, 09/13/2016   Moderna Sars-Covid-2 Vaccination 10/18/2019, 08/07/2020, 11/17/2020   Pneumococcal Polysaccharide-23 11/04/2011   Td 04/25/1995, 12/08/2007    TDAP status: Due, Education has been provided regarding the importance of this vaccine. Advised may receive this vaccine at local pharmacy or Health Dept. Aware to provide a copy of the vaccination record if obtained from local pharmacy or Health Dept. Verbalized acceptance and understanding.    Pneumococcal vaccine status: Due, Education has been provided regarding the importance of this vaccine. Advised may receive this vaccine at local pharmacy or Health Dept. Aware to provide a copy of the vaccination record if obtained from local pharmacy or Health Dept. Verbalized acceptance and understanding.  Covid-19 vaccine status: Completed vaccines  Qualifies for Shingles Vaccine? Yes   Zostavax completed No   Shingrix Completed?: No.    Education has been provided regarding the importance of this vaccine. Patient has been advised to call insurance company to determine out of pocket expense if they have not yet received this vaccine. Advised may also receive vaccine at local pharmacy or Health Dept. Verbalized acceptance and understanding.  Screening Tests Health Maintenance  Topic Date Due   COVID-19 Vaccine (4 - Booster for Moderna series)  03/18/2022 (Originally 01/12/2021)   Zoster Vaccines- Shingrix (1 of 2) 06/02/2022 (Originally [birth year]/1974)   Pneumonia Vaccine 13+  Years old (2 - PCV) 03/03/2023 (Originally 02/11/2020)   COLONOSCOPY (Pts 45-34yr Insurance coverage will need to be confirmed)  03/03/2023 (Originally 02/11/2000)   TETANUS/TDAP  03/03/2023 (Originally 12/07/2017)   INFLUENZA VACCINE  03/22/2022   Hepatitis C Screening  Completed   HPV VACCINES  Aged Out    Health Maintenance  There are no preventive care reminders to display for this patient.     Lung Cancer Screening: (Low Dose CT Chest recommended if Age 67-80years, 30 pack-year currently smoking OR have quit w/in 15years.) does not qualify.     Additional Screening:  Hepatitis C Screening: does qualify; Completed 09/13/16  Vision Screening: Recommended annual ophthalmology exams for early detection of glaucoma and other disorders of the eye. Is the patient up to date with their annual eye exam?  Yes  Who is the provider or what is the name of the office in which the patient attends annual eye exams? Patient deferred If pt is not established with a provider, would they like to be referred to a provider to establish care? No .   Dental Screening: Recommended annual dental exams for proper oral hygiene  Community Resource Referral / Chronic Care Management:  CRR required this visit?  No   CCM required this visit?  No      Plan:     I have personally reviewed and noted the following in the patient's chart:   Medical and social history Use of alcohol, tobacco or illicit drugs  Current medications and supplements including opioid prescriptions. Patient is not currently taking opioid prescriptions. Functional ability and status Nutritional status Physical activity Advanced directives List of other physicians Hospitalizations, surgeries, and ER visits in previous 12 months Vitals Screenings to include cognitive, depression, and falls Referrals and appointments  In addition, I have reviewed and discussed with patient certain preventive protocols, quality metrics, and  best practice recommendations. A written personalized care plan for preventive services as well as general preventive health recommendations were provided to patient.     BCriselda Peaches LPN   [id number]  Nurse Notes: None

## 2022-03-02 NOTE — Patient Instructions (Addendum)
[name] [name] , Thank you for taking time to come for your Medicare Wellness Visit. I appreciate your ongoing commitment to your health goals. Please review the following plan we discussed and let me know if I can assist you in the future.   These are the goals we discussed:  Goals       Stay healthy (pt-stated)        This is a list of the screening recommended for you and due dates:  Health Maintenance  Topic Date Due   COVID-19 Vaccine (4 - Booster for Moderna series) 03/18/2022*   Zoster (Shingles) Vaccine (1 of 2) 06/02/2022*   Pneumonia Vaccine (2 - PCV) 03/03/2023*   Colon Cancer Screening  03/03/2023*   Tetanus Vaccine  03/03/2023*   Flu Shot  03/22/2022   Hepatitis C Screening: USPSTF Recommendation to screen - Ages 18-79 yo.  Completed   HPV Vaccine  Aged Out  *Topic was postponed. The date shown is not the original due date.   Advanced directives: No  Conditions/risks identified: None  Next appointment: Follow up in one year for your annual wellness visit.    Preventive Care 29 Years and Older, Male Preventive care refers to lifestyle choices and visits with your health care provider that can promote health and wellness. What does preventive care include? A yearly physical exam. This is also called an annual well check. Dental exams once or twice a year. Routine eye exams. Ask your health care provider how often you should have your eyes checked. Personal lifestyle choices, including: Daily care of your teeth and gums. Regular physical activity. Eating a healthy diet. Avoiding tobacco and drug use. Limiting alcohol use. Practicing safe sex. Taking low doses of aspirin every day. Taking vitamin and mineral supplements as recommended by your health care provider. What happens during an annual well check? The services and screenings done by your health care provider during your annual well check will depend on your age, overall health, lifestyle risk factors,  and family history of disease. Counseling  Your health care provider may ask you questions about your: Alcohol use. Tobacco use. Drug use. Emotional well-being. Home and relationship well-being. Sexual activity. Eating habits. History of falls. Memory and ability to understand (cognition). Work and work Statistician. Screening  You may have the following tests or measurements: Height, weight, and BMI. Blood pressure. Lipid and cholesterol levels. These may be checked every 5 years, or more frequently if you are over 79 years old. Skin check. Lung cancer screening. You may have this screening every year starting at age 63 if you have a 30-pack-year history of smoking and currently smoke or have quit within the past 15 years. Fecal occult blood test (FOBT) of the stool. You may have this test every year starting at age 76. Flexible sigmoidoscopy or colonoscopy. You may have a sigmoidoscopy every 5 years or a colonoscopy every 10 years starting at age 83. Prostate cancer screening. Recommendations will vary depending on your family history and other risks. Hepatitis C blood test. Hepatitis B blood test. Sexually transmitted disease (STD) testing. Diabetes screening. This is done by checking your blood sugar (glucose) after you have not eaten for a while (fasting). You may have this done every 1-3 years. Abdominal aortic aneurysm (AAA) screening. You may need this if you are a current or former smoker. Osteoporosis. You may be screened starting at age 40 if you are at high risk. Talk with your health care provider about your test results, treatment  options, and if necessary, the need for more tests. Vaccines  Your health care provider may recommend certain vaccines, such as: Influenza vaccine. This is recommended every year. Tetanus, diphtheria, and acellular pertussis (Tdap, Td) vaccine. You may need a Td booster every 10 years. Zoster vaccine. You may need this after age  56. Pneumococcal 13-valent conjugate (PCV13) vaccine. One dose is recommended after age 42. Pneumococcal polysaccharide (PPSV23) vaccine. One dose is recommended after age 69. Talk to your health care provider about which screenings and vaccines you need and how often you need them. This information is not intended to replace advice given to you by your health care provider. Make sure you discuss any questions you have with your health care provider. Document Released: 09/04/2015 Document Revised: 04/27/2016 Document Reviewed: 06/09/2015 Elsevier Interactive Patient Education  2017 [place] Prevention in the Home Falls can cause injuries. They can happen to people of all ages. There are many things you can do to make your home safe and to help prevent falls. What can I do on the outside of my home? Regularly fix the edges of walkways and driveways and fix any cracks. Remove anything that might make you trip as you walk through a door, such as a raised step or threshold. Trim any bushes or trees on the path to your home. Use bright outdoor lighting. Clear any walking paths of anything that might make someone trip, such as rocks or tools. Regularly check to see if handrails are loose or broken. Make sure that both sides of any steps have handrails. Any raised decks and porches should have guardrails on the edges. Have any leaves, snow, or ice cleared regularly. Use sand or salt on walking paths during winter. Clean up any spills in your garage right away. This includes oil or grease spills. What can I do in the bathroom? Use night lights. Install grab bars by the toilet and in the tub and shower. Do not use towel bars as grab bars. Use non-skid mats or decals in the tub or shower. If you need to sit down in the shower, use a plastic, non-slip stool. Keep the floor dry. Clean up any water that spills on the floor as soon as it happens. Remove soap buildup in the tub or shower  regularly. Attach bath mats securely with double-sided non-slip rug tape. Do not have throw rugs and other things on the floor that can make you trip. What can I do in the bedroom? Use night lights. Make sure that you have a light by your bed that is easy to reach. Do not use any sheets or blankets that are too big for your bed. They should not hang down onto the floor. Have a firm chair that has side arms. You can use this for support while you get dressed. Do not have throw rugs and other things on the floor that can make you trip. What can I do in the kitchen? Clean up any spills right away. Avoid walking on wet floors. Keep items that you use a lot in easy-to-reach places. If you need to reach something above you, use a strong step stool that has a grab bar. Keep electrical cords out of the way. Do not use floor polish or wax that makes floors slippery. If you must use wax, use non-skid floor wax. Do not have throw rugs and other things on the floor that can make you trip. What can I do with my stairs? Do not  leave any items on the stairs. Make sure that there are handrails on both sides of the stairs and use them. Fix handrails that are broken or loose. Make sure that handrails are as long as the stairways. Check any carpeting to make sure that it is firmly attached to the stairs. Fix any carpet that is loose or worn. Avoid having throw rugs at the top or bottom of the stairs. If you do have throw rugs, attach them to the floor with carpet tape. Make sure that you have a light switch at the top of the stairs and the bottom of the stairs. If you do not have them, ask someone to add them for you. What else can I do to help prevent falls? Wear shoes that: Do not have high heels. Have rubber bottoms. Are comfortable and fit you well. Are closed at the toe. Do not wear sandals. If you use a stepladder: Make sure that it is fully opened. Do not climb a closed stepladder. Make sure that  both sides of the stepladder are locked into place. Ask someone to hold it for you, if possible. Clearly mark and make sure that you can see: Any grab bars or handrails. First and last steps. Where the edge of each step is. Use tools that help you move around (mobility aids) if they are needed. These include: Canes. Walkers. Scooters. Crutches. Turn on the lights when you go into a dark area. Replace any light bulbs as soon as they burn out. Set up your furniture so you have a clear path. Avoid moving your furniture around. If any of your floors are uneven, fix them. If there are any pets around you, be aware of where they are. Review your medicines with your doctor. Some medicines can make you feel dizzy. This can increase your chance of falling. Ask your doctor what other things that you can do to help prevent falls. This information is not intended to replace advice given to you by your health care provider. Make sure you discuss any questions you have with your health care provider. Document Released: 06/04/2009 Document Revised: 01/14/2016 Document Reviewed: 09/12/2014 Elsevier Interactive Patient Education  2017 [employer].

## 2022-04-16 ENCOUNTER — Other Ambulatory Visit: Payer: Self-pay | Admitting: Family Medicine

## 2022-04-18 NOTE — Telephone Encounter (Signed)
Refill request for Pravastatin 40 mg tabs   LOV - 03/01/21 Next OV - not scheduled Last refill - 01/11/22 #90/0

## 2022-07-20 ENCOUNTER — Other Ambulatory Visit: Payer: Self-pay | Admitting: Family Medicine

## 2022-07-20 NOTE — Telephone Encounter (Signed)
Patient is overdue for AWV part 2 with Dr. Damita Dunnings; please call to schedule

## 2022-07-20 NOTE — Telephone Encounter (Signed)
Spoke to pt, scheduled cpe for 08/04/22

## 2022-07-28 ENCOUNTER — Other Ambulatory Visit (INDEPENDENT_AMBULATORY_CARE_PROVIDER_SITE_OTHER): Payer: PPO

## 2022-07-28 DIAGNOSIS — Z125 Encounter for screening for malignant neoplasm of prostate: Secondary | ICD-10-CM

## 2022-07-28 DIAGNOSIS — E785 Hyperlipidemia, unspecified: Secondary | ICD-10-CM | POA: Diagnosis not present

## 2022-07-28 LAB — LIPID PANEL
Cholesterol: 166 mg/dL (ref 0–200)
HDL: 42.2 mg/dL (ref >39.00)
LDL Cholesterol: 90 mg/dL (ref 0–99)
NonHDL: 123.7
Total CHOL/HDL Ratio: 4
Triglycerides: 168 mg/dL — ABNORMAL HIGH (ref 0.0–149.0)
VLDL: 33.6 mg/dL (ref 0.0–40.0)

## 2022-07-28 LAB — COMPREHENSIVE METABOLIC PANEL WITH GFR
ALT: 25 U/L (ref 0–53)
AST: 22 U/L (ref 0–37)
Albumin: 4.4 g/dL (ref 3.5–5.2)
Alkaline Phosphatase: 66 U/L (ref 39–117)
BUN: 18 mg/dL (ref 6–23)
CO2: 30 meq/L (ref 19–32)
Calcium: 9.2 mg/dL (ref 8.4–10.5)
Chloride: 105 meq/L (ref 96–112)
Creatinine, Ser: 1.14 mg/dL (ref 0.40–1.50)
GFR: 66.57 mL/min
Glucose, Bld: 101 mg/dL — ABNORMAL HIGH (ref 70–99)
Potassium: 4.5 meq/L (ref 3.5–5.1)
Sodium: 140 meq/L (ref 135–145)
Total Bilirubin: 0.8 mg/dL (ref 0.2–1.2)
Total Protein: 6.8 g/dL (ref 6.0–8.3)

## 2022-07-28 LAB — PSA, MEDICARE: PSA: 1.67 ng/ml (ref 0.10–4.00)

## 2022-08-04 ENCOUNTER — Ambulatory Visit (INDEPENDENT_AMBULATORY_CARE_PROVIDER_SITE_OTHER): Payer: PPO | Admitting: Family Medicine

## 2022-08-04 ENCOUNTER — Encounter: Payer: Self-pay | Admitting: Family Medicine

## 2022-08-04 VITALS — BP 122/80 | HR 71 | Temp 97.4°F | Ht 69.0 in | Wt 170.0 lb

## 2022-08-04 DIAGNOSIS — Z23 Encounter for immunization: Secondary | ICD-10-CM | POA: Diagnosis not present

## 2022-08-04 DIAGNOSIS — Z7189 Other specified counseling: Secondary | ICD-10-CM

## 2022-08-04 DIAGNOSIS — E785 Hyperlipidemia, unspecified: Secondary | ICD-10-CM

## 2022-08-04 DIAGNOSIS — Z0000 Encounter for general adult medical examination without abnormal findings: Secondary | ICD-10-CM

## 2022-08-04 MED ORDER — PRAVASTATIN SODIUM 40 MG PO TABS: 40.0000 mg | ORAL_TABLET | Freq: Every day | ORAL | 3 refills | Status: DC

## 2022-08-04 NOTE — Patient Instructions (Addendum)
Check with your insurance to see if they will cover the shingles and tetanus shots.   PNA 20 at some point.  Take care.  Glad to see you. Thanks for your effort.

## 2022-08-04 NOTE — Progress Notes (Signed)
Elevated Cholesterol: Using medications without problems: yes Muscle aches: not from statin, more likely from his job.   Diet compliance: yes Exercise: yes Labs d/w pt.    Flu done fall 2023 Shingles d/w pt.  PNA d/w pt.  Tetanus d/w pt.  Covid vaccine up to date.   Cologuard neg 2022 Prostate cancer screening- PSA d/w pt.  Advance directive-wife designated patient were incapacitated.  In the past year he had inc in stool frequency but not blood in stool.  No black stools.  Now with BM x2 most days.  No abd pain.    His wife had cancer, in remission but she has had a lot going on.  Discussed.    PMH and SH reviewed  Meds, vitals, and allergies reviewed.   ROS: Per HPI unless specifically indicated in ROS section   GEN: nad, alert and oriented HEENT: mucous membranes moist NECK: supple w/o LA CV: rrr. PULM: ctab, no inc wob ABD: soft, +bs EXT: no edema SKIN: no acute rash

## 2022-08-07 DIAGNOSIS — Z0000 Encounter for general adult medical examination without abnormal findings: Secondary | ICD-10-CM | POA: Insufficient documentation

## 2022-08-07 NOTE — Assessment & Plan Note (Signed)
Would continue work on diet and exercise.  Continue pravastatin.  I thanked him for his effort.

## 2022-08-07 NOTE — Assessment & Plan Note (Signed)
Advance directive-wife designated patient were incapacitated. 

## 2022-08-07 NOTE — Assessment & Plan Note (Signed)
Flu due fall 2023 Shingles d/w pt.  PNA d/w pt.  Tetanus d/w pt.  Covid vaccine up to date.   Cologuard neg 2022 Prostate cancer screening- PSA d/w pt.  Advance directive-wife designated patient were incapacitated.

## 2022-11-29 ENCOUNTER — Ambulatory Visit: Payer: PPO | Admitting: Dermatology

## 2022-11-29 VITALS — BP 138/84

## 2022-11-29 DIAGNOSIS — L821 Other seborrheic keratosis: Secondary | ICD-10-CM

## 2022-11-29 DIAGNOSIS — L820 Inflamed seborrheic keratosis: Secondary | ICD-10-CM | POA: Diagnosis not present

## 2022-11-29 DIAGNOSIS — L578 Other skin changes due to chronic exposure to nonionizing radiation: Secondary | ICD-10-CM

## 2022-11-29 NOTE — Patient Instructions (Signed)
Cryotherapy Aftercare  Wash gently with soap and water everyday.   Apply Vaseline and Band-Aid daily until healed.     Due to recent changes in healthcare laws, you may see results of your pathology and/or laboratory studies on MyChart before the doctors have had a chance to review them. We understand that in some cases there may be results that are confusing or concerning to you. Please understand that not all results are received at the same time and often the doctors may need to interpret multiple results in order to provide you with the best plan of care or course of treatment. Therefore, we ask that you please give us 2 business days to thoroughly review all your results before contacting the office for clarification. Should we see a critical lab result, you will be contacted sooner.   If You Need Anything After Your Visit  If you have any questions or concerns for your doctor, please call our main line at [phone] and press option 4 to reach your doctor's medical assistant. If no one answers, please leave a voicemail as directed and we will return your call as soon as possible. Messages left after 4 pm will be answered the following business day.   You may also send us a message via MyChart. We typically respond to MyChart messages within 1-2 business days.  For prescription refills, please ask your pharmacy to contact our office. Our fax number is [phone].  If you have an urgent issue when the clinic is closed that cannot wait until the next business day, you can page your doctor at the number below.    Please note that while we do our best to be available for urgent issues outside of office hours, we are not available 24/7.   If you have an urgent issue and are unable to reach us, you may choose to seek medical care at your doctor's office, retail clinic, urgent care center, or emergency room.  If you have a medical emergency, please immediately call 911 or go to the  emergency department.  Pager Numbers  - Dr. Kowalski: [phone]  - Dr. Moye: [phone]  - Dr. Stewart: [phone]  In the event of inclement weather, please call our main line at [phone] for an update on the status of any delays or closures.  Dermatology Medication Tips: Please keep the boxes that topical medications come in in order to help keep track of the instructions about where and how to use these. Pharmacies typically print the medication instructions only on the boxes and not directly on the medication tubes.   If your medication is too expensive, please contact our office at [phone] option 4 or send us a message through MyChart.   We are unable to tell what your co-pay for medications will be in advance as this is different depending on your insurance coverage. However, we may be able to find a substitute medication at lower cost or fill out paperwork to get insurance to cover a needed medication.   If a prior authorization is required to get your medication covered by your insurance company, please allow us 1-2 business days to complete this process.  Drug prices often vary depending on where the prescription is filled and some pharmacies may offer cheaper prices.  The website www.goodrx.com contains coupons for medications through different pharmacies. The prices here do not account for what the cost may be with help from insurance (it may be cheaper with your insurance), but the website can   give you the price if you did not use any insurance.  - You can print the associated coupon and take it with your prescription to the pharmacy.  - You may also stop by our office during regular business hours and pick up a GoodRx coupon card.  - If you need your prescription sent electronically to a different pharmacy, notify our office through [facility] MyChart or by phone at [phone] option 4.     Si Usted Necesita Algo Despus de Su Visita  Tambin puede  enviarnos un mensaje a travs de MyChart. Por lo general respondemos a los mensajes de MyChart en el transcurso de 1 a 2 das hbiles.  Para renovar recetas, por favor pida a su farmacia que se ponga en contacto con nuestra oficina. Nuestro nmero de fax es el [phone].  Si tiene un asunto urgente cuando la clnica est cerrada y que no puede esperar hasta el siguiente da hbil, puede llamar/localizar a su doctor(a) al nmero que aparece a continuacin.   Por favor, tenga en cuenta que aunque hacemos todo lo posible para estar disponibles para asuntos urgentes fuera del horario de oficina, no estamos disponibles las 24 horas del da, los 7 das de la semana.   Si tiene un problema urgente y no puede comunicarse con nosotros, puede optar por buscar atencin mdica  en el consultorio de su doctor(a), en una clnica privada, en un centro de atencin urgente o en una sala de emergencias.  Si tiene una emergencia mdica, por favor llame inmediatamente al 911 o vaya a la sala de emergencias.  Nmeros de bper  - Dr. Kowalski: [phone]  - Dra. Moye: [phone]  - Dra. Stewart: [phone]  En caso de inclemencias del tiempo, por favor llame a nuestra lnea principal al [phone] para una actualizacin sobre el estado de cualquier retraso o cierre.  Consejos para la medicacin en dermatologa: Por favor, guarde las cajas en las que vienen los medicamentos de uso tpico para ayudarle a seguir las instrucciones sobre dnde y cmo usarlos. Las farmacias generalmente imprimen las instrucciones del medicamento slo en las cajas y no directamente en los tubos del medicamento.   Si su medicamento es muy caro, por favor, pngase en contacto con nuestra oficina llamando al [phone] y presione la opcin 4 o envenos un mensaje a travs de MyChart.   No podemos decirle cul ser su copago por los medicamentos por adelantado ya que esto es diferente dependiendo de la cobertura de su seguro.  Sin embargo, es posible que podamos encontrar un medicamento sustituto a menor costo o llenar un formulario para que el seguro cubra el medicamento que se considera necesario.   Si se requiere una autorizacin previa para que su compaa de seguros cubra su medicamento, por favor permtanos de 1 a 2 das hbiles para completar este proceso.  Los precios de los medicamentos varan con frecuencia dependiendo del lugar de dnde se surte la receta y alguna farmacias pueden ofrecer precios ms baratos.  El sitio web www.goodrx.com tiene cupones para medicamentos de diferentes farmacias. Los precios aqu no tienen en cuenta lo que podra costar con la ayuda del seguro (puede ser ms barato con su seguro), pero el sitio web puede darle el precio si no utiliz ningn seguro.  - Puede imprimir el cupn correspondiente y llevarlo con su receta a la farmacia.  - Tambin puede pasar por nuestra oficina durante el horario de atencin regular y recoger una tarjeta de cupones de GoodRx.  -   Si necesita que su receta se enve electrnicamente a una farmacia diferente, informe a nuestra oficina a travs de MyChart de [facility] o por telfono llamando al [phone] y presione la opcin 4.  

## 2022-11-29 NOTE — Progress Notes (Signed)
   Follow-Up Visit   Subjective  [name] [name] is a 67 y.o. male who presents for the following:  The patient has spots, moles and lesions to be evaluated, some may be new or changing and the patient has concerns that these could be cancer.  The following portions of the chart were reviewed this encounter and updated as appropriate: medications, allergies, medical history  Review of Systems:  No other skin or systemic complaints except as noted in HPI or Assessment and Plan.  Objective  Well appearing patient in no apparent distress; mood and affect are within normal limits. A focused examination was performed of the following areas: face, ears, shoulders Relevant exam findings are noted in the Assessment and Plan.   Assessment & Plan   INFLAMED SEBORRHEIC KERATOSIS Exam: Erythematous keratotic or waxy stuck-on papule or plaque.  Symptomatic, irritating, patient would like treated.  Benign-appearing.  Call clinic for new or changing lesions.   Prior to procedure, discussed risks of blister formation, small wound, skin dyspigmentation, or rare scar following treatment. Recommend Vaseline ointment to treated areas while healing.  Destruction Procedure Note Destruction method: cryotherapy   Informed consent: discussed and consent obtained   Lesion destroyed using liquid nitrogen: Yes   Outcome: patient tolerated procedure well with no complications   Post-procedure details: wound care instructions given   Locations: shoulders and right ear # of Lesions Treated: 37  SEBORRHEIC KERATOSIS - Stuck-on, waxy, tan-brown papules and/or plaques  - Benign-appearing - Discussed benign etiology and prognosis. - Observe - Call for any changes -Advised patient fee for LN2 to face would be $350. He can schedule if he is interested  ACTINIC DAMAGE - chronic, secondary to cumulative UV radiation exposure/sun exposure over time - diffuse scaly erythematous macules with underlying  dyspigmentation - Recommend daily broad spectrum sunscreen SPF 30+ to sun-exposed areas, reapply every 2 hours as needed.  - Recommend staying in the shade or wearing long sleeves, sun glasses (UVA+UVB protection) and wide brim hats (4-inch brim around the entire circumference of the hat). - Call for new or changing lesions.   Return if symptoms worsen or fail to improve.  I, Joanie Coddington, CMA, am acting as scribe for Armida Sans, MD .  Documentation: I have reviewed the above documentation for accuracy and completeness, and I agree with the above.  Armida Sans, MD

## 2022-12-12 ENCOUNTER — Encounter: Payer: Self-pay | Admitting: Dermatology

## 2023-01-23 ENCOUNTER — Encounter: Payer: Self-pay | Admitting: Family Medicine

## 2023-01-23 ENCOUNTER — Other Ambulatory Visit (INDEPENDENT_AMBULATORY_CARE_PROVIDER_SITE_OTHER): Payer: PPO

## 2023-01-23 ENCOUNTER — Ambulatory Visit (INDEPENDENT_AMBULATORY_CARE_PROVIDER_SITE_OTHER): Payer: PPO | Admitting: Family Medicine

## 2023-01-23 VITALS — BP 130/62 | HR 63 | Temp 98.1°F | Ht 69.0 in | Wt 171.0 lb

## 2023-01-23 DIAGNOSIS — I459 Conduction disorder, unspecified: Secondary | ICD-10-CM | POA: Insufficient documentation

## 2023-01-23 NOTE — Progress Notes (Unsigned)
Heart fluttering.  Had eval decades ago.  Resolved with cutting out caffeine.  Went for many years w/o troubles.  In the last ~1 month he noted nearly daily sx.  Sx would last variable amount of times but only noted at rest.  Still able to work at baseline w/o sx.  No CP.  It would feel like a dropped/skipped beat, not a racing feeling.  Not lightheaded.  No syncope.  He cut out country ham and it got better.  Last noted sx last week.  He notes similar sx with drinking some types of tea.    Recent tick bite, R chest wall, removed but local irritation and itching.  D/w pt about topical hydrocortisone use.  No FCNAVD.    Meds, vitals, and allergies reviewed.   ROS: Per HPI unless specifically indicated in ROS section

## 2023-01-23 NOTE — Patient Instructions (Signed)
Let me know if you don't get a call about the heart monitor.  Take care.  Glad to see you. I would cut out ham and limit caffeine in the meantime.

## 2023-01-26 NOTE — Assessment & Plan Note (Signed)
At this point still okay for outpatient follow-up.  Would limit salty food like country ham and limit caffeine.  Will check on outpatient monitor and go from there.  He could be having symptomatic PACs or PVCs.  I do not suspect an ominous process.

## 2023-01-30 ENCOUNTER — Other Ambulatory Visit: Payer: Self-pay

## 2023-01-30 ENCOUNTER — Telehealth: Payer: Self-pay | Admitting: Family Medicine

## 2023-01-30 NOTE — Telephone Encounter (Signed)
Called cardiology and asked about patients monitor. They told me it was ordered and patient should receive monitor at home. They could not tell me how long it takes to get the monitor. I called the patient and relayed the information.

## 2023-01-30 NOTE — Telephone Encounter (Signed)
Patient called in to follow-up on order for a heart monitor Re: January 23, 2023 visit

## 2023-02-07 NOTE — Telephone Encounter (Signed)
Patient called in stating that he still has not received the heart monitor yet.

## 2023-02-08 NOTE — Telephone Encounter (Signed)
Called and spoke with cardiology office to see why patient had not received monitor that was ordered June 3rd. After about 15 minutes on hold I was told the order was processed incorrectly when first done which is why patient never received the monitor. Order has been fixed and expedited to patient; should arrive by UPS in 1-2 days. Called and advised patient of this and advised to call back Friday if he does not received monitor.

## 2023-02-16 DIAGNOSIS — I459 Conduction disorder, unspecified: Secondary | ICD-10-CM

## 2023-02-22 DIAGNOSIS — I459 Conduction disorder, unspecified: Secondary | ICD-10-CM | POA: Diagnosis not present

## 2023-03-01 ENCOUNTER — Other Ambulatory Visit: Payer: Self-pay | Admitting: Family Medicine

## 2023-03-01 MED ORDER — METOPROLOL SUCCINATE ER 25 MG PO TB24: 12.5000 mg | ORAL_TABLET | Freq: Every day | ORAL | 3 refills | Status: DC

## 2023-03-07 ENCOUNTER — Ambulatory Visit: Payer: PPO

## 2023-03-07 VITALS — Ht 69.0 in | Wt 170.0 lb

## 2023-03-07 DIAGNOSIS — Z0000 Encounter for general adult medical examination without abnormal findings: Secondary | ICD-10-CM | POA: Diagnosis not present

## 2023-03-07 NOTE — Patient Instructions (Signed)
[name] [name] , Thank you for taking time to come for your Medicare Wellness Visit. I appreciate your ongoing commitment to your health goals. Please review the following plan we discussed and let me know if I can assist you in the future.   These are the goals we discussed:  Goals       Patient Stated      Stay active      Stay healthy (pt-stated)        This is a list of the screening recommended for you and due dates:  Health Maintenance  Topic Date Due   Zoster (Shingles) Vaccine (1 of 2) Never done   Colon Cancer Screening  Never done   DTaP/Tdap/Td vaccine (3 - Tdap) 12/07/2017   COVID-19 Vaccine (4 - 2023-24 season) 04/22/2022   Medicare Annual Wellness Visit  03/03/2023   Flu Shot  03/23/2023   Pneumonia Vaccine  Completed   Hepatitis C Screening  Completed   HPV Vaccine  Aged Out    Advanced directives: Please bring a copy of your health care power of attorney and living will to the office to be added to your chart at your convenience.   Conditions/risks identified: Aim for 30 minutes of exercise or brisk walking, 6-8 glasses of water, and 5 servings of fruits and vegetables each day.   Next appointment: Follow up in one year for your annual wellness visit. 03/07/24 @ 10:45 televisit  Preventive Care 65 Years and Older, Male  Preventive care refers to lifestyle choices and visits with your health care provider that can promote health and wellness. What does preventive care include? A yearly physical exam. This is also called an annual well check. Dental exams once or twice a year. Routine eye exams. Ask your health care provider how often you should have your eyes checked. Personal lifestyle choices, including: Daily care of your teeth and gums. Regular physical activity. Eating a healthy diet. Avoiding tobacco and drug use. Limiting alcohol use. Practicing safe sex. Taking low doses of aspirin every day. Taking vitamin and mineral supplements as recommended  by your health care provider. What happens during an annual well check? The services and screenings done by your health care provider during your annual well check will depend on your age, overall health, lifestyle risk factors, and family history of disease. Counseling  Your health care provider may ask you questions about your: Alcohol use. Tobacco use. Drug use. Emotional well-being. Home and relationship well-being. Sexual activity. Eating habits. History of falls. Memory and ability to understand (cognition). Work and work Astronomer. Screening  You may have the following tests or measurements: Height, weight, and BMI. Blood pressure. Lipid and cholesterol levels. These may be checked every 5 years, or more frequently if you are over 69 years old. Skin check. Lung cancer screening. You may have this screening every year starting at age 70 if you have a 30-pack-year history of smoking and currently smoke or have quit within the past 15 years. Fecal occult blood test (FOBT) of the stool. You may have this test every year starting at age 40. Flexible sigmoidoscopy or colonoscopy. You may have a sigmoidoscopy every 5 years or a colonoscopy every 10 years starting at age 27. Prostate cancer screening. Recommendations will vary depending on your family history and other risks. Hepatitis C blood test. Hepatitis B blood test. Sexually transmitted disease (STD) testing. Diabetes screening. This is done by checking your blood sugar (glucose) after you have not eaten for a  while (fasting). You may have this done every 1-3 years. Abdominal aortic aneurysm (AAA) screening. You may need this if you are a current or former smoker. Osteoporosis. You may be screened starting at age 69 if you are at high risk. Talk with your health care provider about your test results, treatment options, and if necessary, the need for more tests. Vaccines  Your health care provider may recommend certain  vaccines, such as: Influenza vaccine. This is recommended every year. Tetanus, diphtheria, and acellular pertussis (Tdap, Td) vaccine. You may need a Td booster every 10 years. Zoster vaccine. You may need this after age 38. Pneumococcal 13-valent conjugate (PCV13) vaccine. One dose is recommended after age 22. Pneumococcal polysaccharide (PPSV23) vaccine. One dose is recommended after age 67. Talk to your health care provider about which screenings and vaccines you need and how often you need them. This information is not intended to replace advice given to you by your health care provider. Make sure you discuss any questions you have with your health care provider. Document Released: 09/04/2015 Document Revised: 04/27/2016 Document Reviewed: 06/09/2015 Elsevier Interactive Patient Education  2017 [employer].  Fall Prevention in the Home Falls can cause injuries. They can happen to people of all ages. There are many things you can do to make your home safe and to help prevent falls. What can I do on the outside of my home? Regularly fix the edges of walkways and driveways and fix any cracks. Remove anything that might make you trip as you walk through a door, such as a raised step or threshold. Trim any bushes or trees on the path to your home. Use bright outdoor lighting. Clear any walking paths of anything that might make someone trip, such as rocks or tools. Regularly check to see if handrails are loose or broken. Make sure that both sides of any steps have handrails. Any raised decks and porches should have guardrails on the edges. Have any leaves, snow, or ice cleared regularly. Use sand or salt on walking paths during winter. Clean up any spills in your garage right away. This includes oil or grease spills. What can I do in the bathroom? Use night lights. Install grab bars by the toilet and in the tub and shower. Do not use towel bars as grab bars. Use non-skid mats or decals in  the tub or shower. If you need to sit down in the shower, use a plastic, non-slip stool. Keep the floor dry. Clean up any water that spills on the floor as soon as it happens. Remove soap buildup in the tub or shower regularly. Attach bath mats securely with double-sided non-slip rug tape. Do not have throw rugs and other things on the floor that can make you trip. What can I do in the bedroom? Use night lights. Make sure that you have a light by your bed that is easy to reach. Do not use any sheets or blankets that are too big for your bed. They should not hang down onto the floor. Have a firm chair that has side arms. You can use this for support while you get dressed. Do not have throw rugs and other things on the floor that can make you trip. What can I do in the kitchen? Clean up any spills right away. Avoid walking on wet floors. Keep items that you use a lot in easy-to-reach places. If you need to reach something above you, use a strong step stool that has a grab  bar. Keep electrical cords out of the way. Do not use floor polish or wax that makes floors slippery. If you must use wax, use non-skid floor wax. Do not have throw rugs and other things on the floor that can make you trip. What can I do with my stairs? Do not leave any items on the stairs. Make sure that there are handrails on both sides of the stairs and use them. Fix handrails that are broken or loose. Make sure that handrails are as long as the stairways. Check any carpeting to make sure that it is firmly attached to the stairs. Fix any carpet that is loose or worn. Avoid having throw rugs at the top or bottom of the stairs. If you do have throw rugs, attach them to the floor with carpet tape. Make sure that you have a light switch at the top of the stairs and the bottom of the stairs. If you do not have them, ask someone to add them for you. What else can I do to help prevent falls? Wear shoes that: Do not have high  heels. Have rubber bottoms. Are comfortable and fit you well. Are closed at the toe. Do not wear sandals. If you use a stepladder: Make sure that it is fully opened. Do not climb a closed stepladder. Make sure that both sides of the stepladder are locked into place. Ask someone to hold it for you, if possible. Clearly mark and make sure that you can see: Any grab bars or handrails. First and last steps. Where the edge of each step is. Use tools that help you move around (mobility aids) if they are needed. These include: Canes. Walkers. Scooters. Crutches. Turn on the lights when you go into a dark area. Replace any light bulbs as soon as they burn out. Set up your furniture so you have a clear path. Avoid moving your furniture around. If any of your floors are uneven, fix them. If there are any pets around you, be aware of where they are. Review your medicines with your doctor. Some medicines can make you feel dizzy. This can increase your chance of falling. Ask your doctor what other things that you can do to help prevent falls. This information is not intended to replace advice given to you by your health care provider. Make sure you discuss any questions you have with your health care provider. Document Released: 06/04/2009 Document Revised: 01/14/2016 Document Reviewed: 09/12/2014 Elsevier Interactive Patient Education  2017 [employer].

## 2023-03-07 NOTE — Progress Notes (Signed)
Subjective:   [name] [name] is a 67 y.o. male who presents for Medicare Annual/Subsequent preventive examination.  Visit Complete: Virtual  I connected with  [name] on 03/07/23 by a audio enabled telemedicine application and verified that I am speaking with the correct person using two identifiers.  Patient Location: Home  Provider Location: Home Office  I discussed the limitations of evaluation and management by telemedicine. The patient expressed understanding and agreed to proceed.  Per patient no change in vitals since last visit, unable to obtain new vitals due to telehealth visit   Review of Systems      Cardiac Risk Factors include: advanced age (>51men, >28 women);dyslipidemia;male gender     Objective:    Today's Vitals   03/07/23 0943  Weight: 170 lb (77.1 kg)  Height: 5\' 9"  (1.753 m)   Body mass index is 25.1 kg/m.     03/07/2023    9:49 AM 03/02/2022    1:44 PM 06/14/2019   11:33 AM 06/10/2019   10:19 AM 06/05/2019    9:55 AM 12/08/2016    6:28 AM  Advanced Directives  Does Patient Have a Medical Advance Directive? Yes No No No No No  Type of Estate agent of [place];Living will       Copy of Healthcare Power of Attorney in Chart? No - copy requested       Would patient like information on creating a medical advance directive?  No - Patient declined No - Patient declined   No - Patient declined    Current Medications (verified) Outpatient Encounter Medications as of 03/07/2023  Medication Sig   Glucosamine-Chondroit-Vit C-Mn (GLUCOSAMINE 1500 COMPLEX) CAPS Take 1 capsule by mouth daily.     metoprolol succinate (TOPROL-XL) 25 MG 24 hr tablet Take 0.5-1 tablets (12.5-25 mg total) by mouth daily.   Omega-3 Fatty Acids (FISH OIL) 1000 MG CAPS Take 1 capsule by mouth 2 (two) times daily.    pravastatin (PRAVACHOL) 40 MG tablet Take 1 tablet (40 mg total) by mouth at bedtime.   No facility-administered encounter  medications on file as of 03/07/2023.    Allergies (verified) Patient has no known allergies.   History: Past Medical History:  Diagnosis Date   Hyperlipidemia    Impotence of organic origin    Nephrolithiasis    Nonspecific abnormal results of liver function study    Palpitations    secondary to PVCs   PONV (postoperative nausea and vomiting)    Trigger finger (acquired)    Right, middle   Past Surgical History:  Procedure Laterality Date   CT ABD W & PELVIS WO CM  12/21/2006   [facility]   EXTRACORPOREAL SHOCK WAVE LITHOTRIPSY Left 12/08/2016   Procedure: EXTRACORPOREAL SHOCK WAVE LITHOTRIPSY (ESWL);  Surgeon: [name], MD;  Location: [facility];  Service: Urology;  Laterality: Left;   LAMINECTOMY  08/1989   L5   LAMINECTOMY  08/2004   S1   RADIAL HEAD ARTHROPLASTY Right 06/14/2019   Procedure: RIGHT ELBOW RADIAL HEAD REPLACEMENT;  Surgeon: [name], MD;  Location: [facility];  Service: Orthopedics;  Laterality: Right;   Family History  Problem Relation Age of Onset   Diabetes Mother    Hypertension Mother    Heart disease Neg Hx    Cancer Neg Hx    Prostate cancer Neg Hx    Colon cancer Neg Hx    Social History   Socioeconomic History  Marital status: Married    Spouse name: Not on file   Number of children: 1   Years of education: Not on file   Highest education level: Not on file  Occupational History   Occupation: [employer]  Tobacco Use   Smoking status: Never   Smokeless tobacco: Never  Vaping Use   Vaping status: Never Used  Substance and Sexual Activity   Alcohol use: No   Drug use: No   Sexual activity: Not on file  Other Topics Concern   Not on file  Social History Narrative   Lives with wife, married 1993   1 step-son   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, does commercial tent work   Social Determinants of Corporate investment banker Strain: Low Risk  (03/07/2023)   Overall Financial Resource  Strain (CARDIA)    Difficulty of Paying Living Expenses: Not hard at all  Food Insecurity: No Food Insecurity (03/07/2023)   Hunger Vital Sign    Worried About Running Out of Food in the Last Year: Never true    Ran Out of Food in the Last Year: Never true  Transportation Needs: No Transportation Needs (03/07/2023)   PRAPARE - Administrator, Civil Service (Medical): No    Lack of Transportation (Non-Medical): No  Physical Activity: Inactive (03/07/2023)   Exercise Vital Sign    Days of Exercise per Week: 0 days    Minutes of Exercise per Session: 0 min  Stress: No Stress Concern Present (03/07/2023)   [employer] of Occupational Health - Occupational Stress Questionnaire    Feeling of Stress : Not at all  Social Connections: Moderately Integrated (03/07/2023)   Social Connection and Isolation Panel [NHANES]    Frequency of Communication with Friends and Family: More than three times a week    Frequency of Social Gatherings with Friends and Family: More than three times a week    Attends Religious Services: More than 4 times per year    Active Member of [employer] or Organizations: No    Attends Engineer, structural: Never    Marital Status: Married    Tobacco Counseling Counseling given: Not Answered   Clinical Intake:  Pre-visit preparation completed: Yes  Pain : No/denies pain     BMI - recorded: 25.1 Nutritional Status: BMI 25 -29 Overweight Nutritional Risks: None Diabetes: No  How often do you need to have someone help you when you read instructions, pamphlets, or other written materials from your doctor or pharmacy?: 1 - Never  Interpreter Needed?: No  Information entered by :: C.Kaiden Dardis LPN   Activities of Daily Living    03/07/2023    9:49 AM  In your present state of health, do you have any difficulty performing the following activities:  Hearing? 0  Vision? 0  Difficulty concentrating or making decisions? 0  Walking or climbing  stairs? 0  Dressing or bathing? 0  Doing errands, shopping? 0  Preparing Food and eating ? N  Using the Toilet? N  In the past six months, have you accidently leaked urine? N  Do you have problems with loss of bowel control? N  Managing your Medications? N  Managing your Finances? N  Housekeeping or managing your Housekeeping? N    Patient Care Team: Joaquim Nam, MD as PCP - General  Indicate any recent Medical Services you may have received from other than [facility] providers in the past year (date may be approximate).  Assessment:   This is a routine wellness examination for [place].  Hearing/Vision screen Hearing Screening - Comments:: Denies hearing difficulties   Vision Screening - Comments:: Readers - unknown provider - pt stated he will call and set up appointment for eye exam  Dietary issues and exercise activities discussed:     Goals Addressed             This Visit's Progress    Patient Stated       Stay active       Depression Screen    03/07/2023    9:48 AM 01/23/2023    8:16 AM 03/02/2022    1:41 PM 03/02/2021    9:05 AM 03/01/2021    8:57 AM 03/20/2018    9:37 AM 09/13/2016    9:54 AM  PHQ 2/9 Scores  PHQ - 2 Score 0 0 0 0 0 0 2  PHQ- 9 Score 0 0  0 0  3    Fall Risk    03/07/2023    9:46 AM 01/23/2023    8:16 AM 03/02/2022    1:43 PM 03/01/2021    8:57 AM  Fall Risk   Falls in the past year? 0 0 0 1  Number falls in past yr: 0 0 0 0  Injury with Fall? 0 0 0 1  Risk for fall due to : No Fall Risks No Fall Risks No Fall Risks History of fall(s)  Follow up Falls prevention discussed;Falls evaluation completed Falls evaluation completed      MEDICARE RISK AT HOME:  Medicare Risk at Home - 03/07/23 0950     Any stairs in or around the home? Yes    If so, are there any without handrails? No    Home free of loose throw rugs in walkways, pet beds, electrical cords, etc? Yes    Adequate lighting in your home to reduce risk of falls? Yes    Life  alert? No    Use of a cane, walker or w/c? No    Grab bars in the bathroom? No    Shower chair or bench in shower? Yes    Elevated toilet seat or a handicapped toilet? Yes             TIMED UP AND GO:  Was the test performed?  No    Cognitive Function:        03/07/2023    9:50 AM 03/02/2022    1:44 PM  6CIT Screen  What Year? 0 points 0 points  What month? 0 points 0 points  What time? 0 points 0 points  Count back from 20 0 points 0 points  Months in reverse 0 points 0 points  Repeat phrase 0 points 0 points  Total Score 0 points 0 points    Immunizations Immunization History  Administered Date(s) Administered   Fluad Quad(high Dose 65+) 08/04/2022   Hep A / Hep B 05/29/2014, 07/29/2014   Influenza Whole 07/26/2010   Influenza,inj,Quad PF,6+ Mos 08/12/2014, 09/13/2016   Moderna Sars-Covid-2 Vaccination 10/18/2019, 08/07/2020, 11/17/2020   PNEUMOCOCCAL CONJUGATE-20 07/07/2021   Pneumococcal Polysaccharide-23 11/04/2011   Td 04/25/1995, 12/08/2007    TDAP status: Due, Education has been provided regarding the importance of this vaccine. Advised may receive this vaccine at local pharmacy or Health Dept. Aware to provide a copy of the vaccination record if obtained from local pharmacy or Health Dept. Verbalized acceptance and understanding.  Flu Vaccine status: Up to date  Pneumococcal vaccine status:  Up to date  Covid-19 vaccine status: Information provided on how to obtain vaccines.   Qualifies for Shingles Vaccine? Yes   Zostavax completed No   Shingrix Completed?: No.    Education has been provided regarding the importance of this vaccine. Patient has been advised to call insurance company to determine out of pocket expense if they have not yet received this vaccine. Advised may also receive vaccine at local pharmacy or Health Dept. Verbalized acceptance and understanding.  Screening Tests Health Maintenance  Topic Date Due   Zoster Vaccines- Shingrix (1  of 2) Never done   Colonoscopy  Never done   DTaP/Tdap/Td (3 - Tdap) 12/07/2017   COVID-19 Vaccine (4 - 2023-24 season) 04/22/2022   Medicare Annual Wellness (AWV)  03/03/2023   INFLUENZA VACCINE  03/23/2023   Pneumonia Vaccine 54+ Years old  Completed   Hepatitis C Screening  Completed   HPV VACCINES  Aged Out    Health Maintenance  Health Maintenance Due  Topic Date Due   Zoster Vaccines- Shingrix (1 of 2) Never done   Colonoscopy  Never done   DTaP/Tdap/Td (3 - Tdap) 12/07/2017   COVID-19 Vaccine (4 - 2023-24 season) 04/22/2022   Medicare Annual Wellness (AWV)  03/03/2023    Colonoscopy -Patient declined.  Lung Cancer Screening: (Low Dose CT Chest recommended if Age 59-80 years, 20 pack-year currently smoking OR have quit w/in 15years.) does not qualify.   Lung Cancer Screening Referral: n/a  Additional Screening:  Hepatitis C Screening: does qualify; Completed 09/13/16  Vision Screening: Recommended annual ophthalmology exams for early detection of glaucoma and other disorders of the eye. Is the patient up to date with their annual eye exam?  No Pt stated he would call and schedule eye exam. Who is the provider or what is the name of the office in which the patient attends annual eye exams? Unknown provider. If pt is not established with a provider, would they like to be referred to a provider to establish care? Yes .   Dental Screening: Recommended annual dental exams for proper oral hygiene    Community Resource Referral / Chronic Care Management: CRR required this visit?  No   CCM required this visit?  No     Plan:     I have personally reviewed and noted the following in the patient's chart:   Medical and social history Use of alcohol, tobacco or illicit drugs  Current medications and supplements including opioid prescriptions. Patient is not currently taking opioid prescriptions. Functional ability and status Nutritional status Physical  activity Advanced directives List of other physicians Hospitalizations, surgeries, and ER visits in previous 12 months Vitals Screenings to include cognitive, depression, and falls Referrals and appointments  In addition, I have reviewed and discussed with patient certain preventive protocols, quality metrics, and best practice recommendations. A written personalized care plan for preventive services as well as general preventive health recommendations were provided to patient.     Maryan Puls, LPN   0/98/1191   After Visit Summary: (MyChart) Due to this being a telephonic visit, the after visit summary with patients personalized plan was offered to patient via MyChart   Nurse Notes: none

## 2023-03-31 ENCOUNTER — Ambulatory Visit: Payer: PPO | Admitting: Family Medicine

## 2023-04-03 ENCOUNTER — Encounter: Payer: Self-pay | Admitting: Family Medicine

## 2023-04-03 ENCOUNTER — Ambulatory Visit (INDEPENDENT_AMBULATORY_CARE_PROVIDER_SITE_OTHER): Payer: PPO | Admitting: Family Medicine

## 2023-04-03 VITALS — BP 138/68 | HR 81 | Temp 98.0°F | Ht 69.0 in | Wt 173.8 lb

## 2023-04-03 DIAGNOSIS — I459 Conduction disorder, unspecified: Secondary | ICD-10-CM | POA: Diagnosis not present

## 2023-04-03 NOTE — Patient Instructions (Addendum)
Keep taking 1/2 tab of metoprolol daily.  If more symptoms, you can take extra 1/2 tab at any point.  Stay hydrated.  If needed, can increase to 1 full tab a day.  Update me as needed.  Take care.  Glad to see you.

## 2023-04-03 NOTE — Progress Notes (Signed)
D/w pt about heart rate. Taking 12.5mg  metoprolol.  He isn't lightheaded.  After about 1 week of tx, he thought it was helping some but then he had a day with symptoms.  Then for the last 6 days, other than yesterday, he had not symptoms.  Mild sx yesterday, less intense, shorter episodes, and shorter duration.  No syncope.  No CP.  No caffeine.    Meds, vitals, and allergies reviewed.   ROS: Per HPI unless specifically indicated in ROS section   GEN: nad, alert and oriented HEENT: ncat NECK: supple w/o LA CV: rrr PULM: ctab, no inc wob ABD: soft, +bs EXT: no edema SKIN: well perfused.

## 2023-04-03 NOTE — Assessment & Plan Note (Signed)
D/w pt about prior cardiac monitor results.   Overall improved.   Would keep taking 1/2 tab of metoprolol daily.  If more symptoms, can take extra 1/2 tab at any point.  Stay hydrated.  If needed, can increase to 1 full tab of metoprolol a day.  Update me as needed. He agrees.

## 2023-09-18 ENCOUNTER — Ambulatory Visit: Payer: PPO | Admitting: Dermatology

## 2023-09-18 ENCOUNTER — Encounter: Payer: Self-pay | Admitting: Dermatology

## 2023-09-18 DIAGNOSIS — L820 Inflamed seborrheic keratosis: Secondary | ICD-10-CM

## 2023-09-18 NOTE — Patient Instructions (Addendum)
Cryotherapy Aftercare  Wash gently with soap and water everyday.   Apply Vaseline and Band-Aid daily until healed.    Due to recent changes in healthcare laws, you may see results of your pathology and/or laboratory studies on MyChart before the doctors have had a chance to review them. We understand that in some cases there may be results that are confusing or concerning to you. Please understand that not all results are received at the same time and often the doctors may need to interpret multiple results in order to provide you with the best plan of care or course of treatment. Therefore, we ask that you please give [place] 2 business days to thoroughly review all your results before contacting the office for clarification. Should we see a critical lab result, you will be contacted sooner.   If You Need Anything After Your Visit  If you have any questions or concerns for your doctor, please call our main line at [phone] and press option 4 to reach your doctor's medical assistant. If no one answers, please leave a voicemail as directed and we will return your call as soon as possible. Messages left after 4 pm will be answered the following business day.   You may also send [place] a message via MyChart. We typically respond to MyChart messages within 1-2 business days.  For prescription refills, please ask your pharmacy to contact our office. Our fax number is [phone].  If you have an urgent issue when the clinic is closed that cannot wait until the next business day, you can page your doctor at the number below.    Please note that while we do our best to be available for urgent issues outside of office hours, we are not available 24/7.   If you have an urgent issue and are unable to reach [place], you may choose to seek medical care at your doctor's office, retail clinic, urgent care center, or emergency room.  If you have a medical emergency, please immediately call 911 or go to the emergency  department.  Pager Numbers  - Dr. Gwen Pounds: [phone]  - Dr. Roseanne Reno: [phone]  - Dr. Katrinka Blazing: [phone]   In the event of inclement weather, please call our main line at [phone] for an update on the status of any delays or closures.  Dermatology Medication Tips: Please keep the boxes that topical medications come in in order to help keep track of the instructions about where and how to use these. Pharmacies typically print the medication instructions only on the boxes and not directly on the medication tubes.   If your medication is too expensive, please contact our office at [phone] option 4 or send [place] a message through MyChart.   We are unable to tell what your co-pay for medications will be in advance as this is different depending on your insurance coverage. However, we may be able to find a substitute medication at lower cost or fill out paperwork to get insurance to cover a needed medication.   If a prior authorization is required to get your medication covered by your insurance company, please allow [place] 1-2 business days to complete this process.  Drug prices often vary depending on where the prescription is filled and some pharmacies may offer cheaper prices.  The website www.goodrx.com contains coupons for medications through different pharmacies. The prices here do not account for what the cost may be with help from insurance (it may be cheaper with your insurance), but the website can  give you the price if you did not use any insurance.  - You can print the associated coupon and take it with your prescription to the pharmacy.  - You may also stop by our office during regular business hours and pick up a GoodRx coupon card.  - If you need your prescription sent electronically to a different pharmacy, notify our office through [facility] or by phone at [phone] option 4.     Si Usted Necesita Algo Despus de Su Visita  Tambin puede enviarnos  un mensaje a travs de Clinical cytogeneticist. Por lo general respondemos a los mensajes de MyChart en el transcurso de 1 a 2 das hbiles.  Para renovar recetas, por favor pida a su farmacia que se ponga en contacto con nuestra oficina. Annie Sable de fax es [place] [phone].  Si tiene un asunto urgente cuando la clnica est cerrada y que no puede esperar hasta el siguiente da hbil, puede llamar/localizar a su doctor(a) al nmero que aparece a continuacin.   Por favor, tenga en cuenta que aunque hacemos todo lo posible para estar disponibles para asuntos urgentes fuera del horario de [place], no estamos disponibles las 24 horas del da, los 7 [address]  [address] de la [place].   Si tiene un problema urgente y no puede comunicarse con nosotros, puede optar por buscar atencin mdica  en el consultorio de su doctor(a), en una clnica privada, en un centro de atencin urgente o en una sala de emergencias.  Si tiene Engineer, drilling, por favor llame inmediatamente al 911 o vaya a la sala de emergencias.  Nmeros de bper  - Dr. Gwen Pounds: [phone]  - Dra. Roseanne Reno: [phone]  - Dr. Katrinka Blazing: [phone]   En caso de inclemencias del tiempo, por favor llame a Lacy Duverney principal al [phone] para una actualizacin sobre el [place] de cualquier retraso o cierre.  Consejos para la medicacin en dermatologa: Por favor, guarde las cajas en las que vienen los medicamentos de uso tpico para ayudarle a seguir las instrucciones sobre dnde y cmo usarlos. Las farmacias generalmente imprimen las instrucciones del medicamento slo en las cajas y no directamente en los tubos del [place].   Si su medicamento es muy caro, por favor, pngase en contacto con Rolm Gala llamando al [phone] y presione la opcin 4 o envenos un mensaje a travs de Clinical cytogeneticist.   No podemos decirle cul ser su copago por los medicamentos por adelantado ya que esto es diferente dependiendo de la cobertura de su seguro. Sin  embargo, es posible que podamos encontrar un medicamento sustituto a Audiological scientist un formulario para que el seguro cubra el medicamento que se considera necesario.   Si se requiere una autorizacin previa para que su compaa de seguros [place] su medicamento, por favor permtanos de 1 a 2 das hbiles para completar [address]  Los precios de los medicamentos varan con frecuencia dependiendo del Environmental consultant de dnde se surte la receta y alguna farmacias pueden ofrecer precios ms baratos.  El sitio web www.goodrx.com tiene cupones para medicamentos de Health and safety inspector. Los precios aqu no tienen en cuenta lo que podra costar con la ayuda del seguro (puede ser ms barato con su seguro), pero el sitio web puede darle el precio si no utiliz Tourist information centre manager.  - Puede imprimir el cupn correspondiente y llevarlo con su receta a la farmacia.  - Tambin puede pasar por nuestra oficina durante el horario de atencin regular y Education officer, museum una tarjeta de cupones de GoodRx.  -  Si necesita que su receta se enve electrnicamente a Psychiatrist, informe a nuestra oficina a travs de MyChart de [place] o por telfono llamando al [phone] y presione la opcin 4.

## 2023-09-18 NOTE — Progress Notes (Signed)
   Follow-Up Visit   Subjective  [name] [name] is a 68 y.o. male who presents for the following: spot on L forehead not sore and R forehead, pt also has skin tags on neck that are bothersome when he shaves that he would like looked at.  The patient has spots, moles and lesions to be evaluated, some may be new or changing and the patient may have concern these could be cancer.   The following portions of the chart were reviewed this encounter and updated as appropriate: medications, allergies, medical history  Review of Systems:  No other skin or systemic complaints except as noted in HPI or Assessment and Plan.  Objective  Well appearing patient in no apparent distress; mood and affect are within normal limits.        A focused examination was performed of the following areas: Forehead  Relevant exam findings are noted in the Assessment and Plan.  Right forehead x1, L forehead x1, L temple scalp x1, neck x 8, L chest x1 (12) Stuck on waxy paps with erythema  Assessment & Plan     INFLAMED SEBORRHEIC KERATOSIS (12) Right forehead x1, L forehead x1, L temple scalp x1, neck x 8, L chest x1 (12) Symptomatic, irritating, patient would like treated. Destruction of lesion - Right forehead x1, L forehead x1, L temple scalp x1, neck x 8, L chest x1 (12) Complexity: simple   Destruction method: cryotherapy   Informed consent: discussed and consent obtained   Timeout:  patient name, date of birth, surgical site, and procedure verified Lesion destroyed using liquid nitrogen: Yes   Region frozen until ice ball extended beyond lesion: Yes   Cryo cycles: 1 or 2. Outcome: patient tolerated procedure well with no complications   Post-procedure details: wound care instructions given    Return if symptoms worsen or fail to improve.  Wynonia Lawman, CMA, am acting as scribe for Elie Goody, MD .   Documentation: I have reviewed the above documentation for accuracy and  completeness, and I agree with the above.  Elie Goody, MD

## 2023-10-19 ENCOUNTER — Other Ambulatory Visit: Payer: Self-pay | Admitting: Family Medicine

## 2023-10-19 NOTE — Telephone Encounter (Signed)
 LAST REFILL: pravastatin (PRAVACHOL) 40 MG tablet 08/04/2022 90 TABLETS 0 REFILLS LAST OFFICE VISIT: 04/03/23 NEXT OFFICE VISIT: NOTHING SCHEDULED

## 2024-02-27 ENCOUNTER — Other Ambulatory Visit: Payer: Self-pay | Admitting: Family Medicine

## 2024-02-27 NOTE — Telephone Encounter (Signed)
 Pt had a few acute appt for heart issues (last appt 04/03/23) but last AWV was on 08/04/22 and no future appt, please advise

## 2024-02-28 NOTE — Telephone Encounter (Signed)
 Sent with note for OV.  Thanks.

## 2024-03-28 ENCOUNTER — Ambulatory Visit

## 2024-03-28 VITALS — BP 138/68 | Ht 69.0 in | Wt 174.0 lb

## 2024-03-28 DIAGNOSIS — Z0000 Encounter for general adult medical examination without abnormal findings: Secondary | ICD-10-CM | POA: Diagnosis not present

## 2024-03-28 NOTE — Progress Notes (Signed)
 Because this visit was a virtual/telehealth visit,  certain criteria was not obtained, such a blood pressure, CBG if applicable, and timed get up and go. Any medications not marked as taking were not mentioned during the medication reconciliation part of the visit. Any vitals not documented were not able to be obtained due to this being a telehealth visit or patient was unable to self-report a recent blood pressure reading due to a lack of equipment at home via telehealth. Vitals that have been documented are verbally provided by the patient.   This visit was performed by a medical professional under my direct supervision. I was immediately available for consultation/collaboration. I have reviewed and agree with the Annual Wellness Visit documentation.  Subjective:   [name] [name] is a 68 y.o. who presents for a Medicare Wellness preventive visit.  As a reminder, Annual Wellness Visits don't include a physical exam, and some assessments may be limited, especially if this visit is performed virtually. We may recommend an in-person follow-up visit with your provider if needed.  Visit Complete: Virtual I connected with  [name] [name] on 03/28/24 by a audio enabled telemedicine application and verified that I am speaking with the correct person using two identifiers.  Patient Location: Home  Provider Location: Home Office  I discussed the limitations of evaluation and management by telemedicine. The patient expressed understanding and agreed to proceed.  Vital Signs: Because this visit was a virtual/telehealth visit, some criteria may be missing or patient reported. Any vitals not documented were not able to be obtained and vitals that have been documented are patient reported.  VideoDeclined- This patient declined Librarian, academic. Therefore the visit was completed with audio only.  Persons Participating in Visit: Patient.  AWV Questionnaire: No: Patient  Medicare AWV questionnaire was not completed prior to this visit.  Cardiac Risk Factors include: advanced age (>72men, >89 women);male gender;dyslipidemia     Objective:    Today's Vitals   03/28/24 1023 03/28/24 1024  BP: 138/68   Weight: 174 lb (78.9 kg)   Height: 5' 9 (1.753 m)   PainSc:  6    Body mass index is 25.7 kg/m.     03/28/2024   10:27 AM 03/07/2023    9:49 AM 03/02/2022    1:44 PM 06/14/2019   11:33 AM 06/10/2019   10:19 AM 06/05/2019    9:55 AM 12/08/2016    6:28 AM  Advanced Directives  Does Patient Have a Medical Advance Directive? No Yes No No No No No   Type of Furniture conservator/restorer;Living will       Copy of Healthcare Power of Attorney in Chart?  No - copy requested       Would patient like information on creating a medical advance directive? No - Patient declined  No - Patient declined No - Patient declined   No - Patient declined      Data saved with a previous flowsheet row definition    Current Medications (verified) Outpatient Encounter Medications as of 03/28/2024  Medication Sig   Glucosamine-Chondroit-Vit C-Mn (GLUCOSAMINE 1500 COMPLEX) CAPS Take 1 capsule by mouth daily.     metoprolol  succinate (TOPROL -XL) 25 MG 24 hr tablet TAKE 0.5-1 TABLETS (12.5-25 MG TOTAL) BY MOUTH DAILY.   Omega-3 Fatty Acids (FISH OIL) 1000 MG CAPS Take 1 capsule by mouth 2 (two) times daily.    pravastatin  (PRAVACHOL ) 40 MG tablet TAKE ONE TABLET BY MOUTH AT BEDTIME  No facility-administered encounter medications on file as of 03/28/2024.    Allergies (verified) Patient has no known allergies.   History: Past Medical History:  Diagnosis Date   Hyperlipidemia    Impotence of organic origin    Nephrolithiasis    Nonspecific abnormal results of liver function study    Palpitations    secondary to PVCs   PONV (postoperative nausea and vomiting)    Trigger finger (acquired)    Right, middle   Past Surgical History:  Procedure  Laterality Date   CT ABD W & PELVIS WO CM  12/21/2006   [facility]   EXTRACORPOREAL SHOCK WAVE LITHOTRIPSY Left 12/08/2016   Procedure: EXTRACORPOREAL SHOCK WAVE LITHOTRIPSY (ESWL);  Surgeon: Ozell JONELLE Burkes, MD;  Location: [facility];  Service: Urology;  Laterality: Left;   LAMINECTOMY  08/1989   L5   LAMINECTOMY  08/2004   S1   RADIAL HEAD ARTHROPLASTY Right 06/14/2019   Procedure: RIGHT ELBOW RADIAL HEAD REPLACEMENT;  Surgeon: Sebastian Lenis, MD;  Location: [facility];  Service: Orthopedics;  Laterality: Right;   Family History  Problem Relation Age of Onset   Diabetes Mother    Hypertension Mother    Heart disease Neg Hx    Cancer Neg Hx    Prostate cancer Neg Hx    Colon cancer Neg Hx    Social History   Socioeconomic History   Marital status: Married    Spouse name: Not on file   Number of children: 1   Years of education: Not on file   Highest education level: Not on file  Occupational History   Occupation: [employer]  Tobacco Use   Smoking status: Never   Smokeless tobacco: Never  Vaping Use   Vaping status: Never Used  Substance and Sexual Activity   Alcohol use: No   Drug use: No   Sexual activity: Not on file  Other Topics Concern   Not on file  Social History Narrative   Lives with wife, married 1993   1 step-son   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, does commercial tent work   Social Drivers of Corporate investment banker Strain: Low Risk  (03/28/2024)   Overall Financial Resource Strain (CARDIA)    Difficulty of Paying Living Expenses: Not hard at all  Food Insecurity: No Food Insecurity (03/28/2024)   Hunger Vital Sign    Worried About Running Out of Food in the Last Year: Never true    Ran Out of Food in the Last Year: Never true  Transportation Needs: No Transportation Needs (03/28/2024)   PRAPARE - Administrator, Civil Service (Medical): No    Lack of Transportation (Non-Medical): No  Physical Activity:  Sufficiently Active (03/28/2024)   Exercise Vital Sign    Days of Exercise per Week: 7 days    Minutes of Exercise per Session: 150+ min  Stress: No Stress Concern Present (03/28/2024)   [employer] of Occupational Health - Occupational Stress Questionnaire    Feeling of Stress: Not at all  Social Connections: Moderately Integrated (03/28/2024)   Social Connection and Isolation Panel    Frequency of Communication with Friends and Family: More than three times a week    Frequency of Social Gatherings with Friends and Family: More than three times a week    Attends Religious Services: More than 4 times per year    Active Member of [employer] or Organizations: No    Attends [employer]  or Organization Meetings: Never    Marital Status: Married    Tobacco Counseling Counseling given: Not Answered    Clinical Intake:  Pre-visit preparation completed: Yes  Pain : 0-10 Pain Score: 6  Pain Type: Chronic pain Pain Location: Shoulder Pain Orientation: Right Pain Descriptors / Indicators: Discomfort, Aching Pain Onset: Today Pain Frequency:  (at night)     BMI - recorded: 25.7 Nutritional Status: BMI 25 -29 Overweight Nutritional Risks: None Diabetes: No  No results found for: HGBA1C   How often do you need to have someone help you when you read instructions, pamphlets, or other written materials from your doctor or pharmacy?: 1 - Never  Interpreter Needed?: No  Information entered by :: Ameliarose Shark,CMA   Activities of Daily Living     03/28/2024   10:26 AM  In your present state of health, do you have any difficulty performing the following activities:  Hearing? 0  Vision? 0  Difficulty concentrating or making decisions? 0  Walking or climbing stairs? 0  Dressing or bathing? 0  Doing errands, shopping? 0  Preparing Food and eating ? N  Using the Toilet? N  In the past six months, have you accidently leaked urine? N  Do you have problems with loss of bowel control? N   Managing your Medications? N  Managing your Finances? N  Housekeeping or managing your Housekeeping? N    Patient Care Team: Cleatus Arlyss RAMAN, MD as PCP - General  I have updated your Care Teams any recent Medical Services you may have received from other providers in the past year.     Assessment:   This is a routine wellness examination for [place].  Hearing/Vision screen Hearing Screening - Comments:: No difficulties Vision Screening - Comments:: Patient wears glasses    Goals Addressed             This Visit's Progress    Patient Stated       To get free        Depression Screen     03/28/2024   10:27 AM 04/03/2023    9:19 AM 03/07/2023    9:48 AM 01/23/2023    8:16 AM 03/02/2022    1:41 PM 03/02/2021    9:05 AM 03/01/2021    8:57 AM  PHQ 2/9 Scores  PHQ - 2 Score 0 0 0 0 0 0 0  PHQ- 9 Score 0 0 0 0  0 0    Fall Risk     03/28/2024   10:27 AM 03/07/2023    9:46 AM 01/23/2023    8:16 AM 03/02/2022    1:43 PM 03/01/2021    8:57 AM  Fall Risk   Falls in the past year? 0 0 0 0 1  Number falls in past yr: 0 0 0 0 0  Injury with Fall? 0 0 0 0 1  Risk for fall due to : No Fall Risks No Fall Risks No Fall Risks No Fall Risks History of fall(s)  Follow up Falls evaluation completed Falls prevention discussed;Falls evaluation completed Falls evaluation completed      MEDICARE RISK AT HOME:  Medicare Risk at Home Any stairs in or around the home?: No If so, are there any without handrails?: No Home free of loose throw rugs in walkways, pet beds, electrical cords, etc?: Yes Adequate lighting in your home to reduce risk of falls?: Yes Life alert?: No Use of a cane, walker or w/c?: No Grab bars in the  bathroom?: No Shower chair or bench in shower?: No Elevated toilet seat or a handicapped toilet?: No  TIMED UP AND GO:  Was the test performed?  No  Cognitive Function: 6CIT completed        03/28/2024   10:25 AM 03/07/2023    9:50 AM 03/02/2022    1:44 PM  6CIT  Screen  What Year? 0 points 0 points 0 points  What month? 0 points 0 points 0 points  What time? 0 points 0 points 0 points  Count back from 20 0 points 0 points 0 points  Months in reverse 0 points 0 points 0 points  Repeat phrase 0 points 0 points 0 points  Total Score 0 points 0 points 0 points    Immunizations Immunization History  Administered Date(s) Administered   Fluad Quad(high Dose 65+) 08/04/2022   Hep A / Hep B 05/29/2014, 07/29/2014   Influenza Whole 07/26/2010   Influenza,inj,Quad PF,6+ Mos 08/12/2014, 09/13/2016   Moderna Sars-Covid-2 Vaccination 10/18/2019, 08/07/2020, 11/17/2020   PNEUMOCOCCAL CONJUGATE-20 07/07/2021   Pneumococcal Polysaccharide-23 11/04/2011   Td 04/25/1995, 12/08/2007    Screening Tests Health Maintenance  Topic Date Due   Zoster Vaccines- Shingrix (1 of 2) Never done   Colonoscopy  Never done   Hepatitis B Vaccines (3 of 3 - 19+ 3-dose series) 11/28/2014   DTaP/Tdap/Td (3 - Tdap) 12/07/2017   COVID-19 Vaccine (4 - 2024-25 season) 04/23/2023   INFLUENZA VACCINE  03/22/2024   Medicare Annual Wellness (AWV)  03/28/2025   Pneumococcal Vaccine: 50+ Years  Completed   Hepatitis C Screening  Completed   HPV VACCINES  Aged Out   Meningococcal B Vaccine  Aged Out    Health Maintenance  Health Maintenance Due  Topic Date Due   Zoster Vaccines- Shingrix (1 of 2) Never done   Colonoscopy  Never done   Hepatitis B Vaccines (3 of 3 - 19+ 3-dose series) 11/28/2014   DTaP/Tdap/Td (3 - Tdap) 12/07/2017   COVID-19 Vaccine (4 - 2024-25 season) 04/23/2023   INFLUENZA VACCINE  03/22/2024   Health Maintenance Items Addressed:patient will discuss later  Additional Screening:  Vision Screening: Recommended annual ophthalmology exams for early detection of glaucoma and other disorders of the eye. Would you like a referral to an eye doctor? No    Dental Screening: Recommended annual dental exams for proper oral hygiene  Community Resource  Referral / Chronic Care Management: CRR required this visit?  No   CCM required this visit?  No   Plan:    I have personally reviewed and noted the following in the patient's chart:   Medical and social history Use of alcohol, tobacco or illicit drugs  Current medications and supplements including opioid prescriptions. Patient is not currently taking opioid prescriptions. Functional ability and status Nutritional status Physical activity Advanced directives List of other physicians Hospitalizations, surgeries, and ER visits in previous 12 months Vitals Screenings to include cognitive, depression, and falls Referrals and appointments  In addition, I have reviewed and discussed with patient certain preventive protocols, quality metrics, and best practice recommendations. A written personalized care plan for preventive services as well as general preventive health recommendations were provided to patient.   Lyle MARLA Right, [place]   03/28/2024   After Visit Summary: (MyChart) Due to this being a telephonic visit, the after visit summary with patients personalized plan was offered to patient via MyChart   Notes: Nothing significant to report at this time.

## 2024-03-28 NOTE — Patient Instructions (Signed)
 Mr. [name] [name] , Thank you for taking time out of your busy schedule to complete your Annual Wellness Visit with me. I enjoyed our conversation and look forward to speaking with you again next year. I, as well as your care team,  appreciate your ongoing commitment to your health goals. Please review the following plan we discussed and let me know if I can assist you in the future. Your Game plan/ To Do List    Referrals: If you haven't heard from the office you've been referred to, please reach out to them at the phone provided.   Follow up Visits: We will see or speak with you next year for your Next Medicare AWV with our clinical staff Have you seen your provider in the last 6 months (3 months if uncontrolled diabetes)? yes  Clinician Recommendations:  Aim for 30 minutes of exercise or brisk walking, 6-8 glasses of water, and 5 servings of fruits and vegetables each day.       This is a list of the screenings recommended for you:  Health Maintenance  Topic Date Due   Zoster (Shingles) Vaccine (1 of 2) Never done   Colon Cancer Screening  Never done   Hepatitis B Vaccine (3 of 3 - 19+ 3-dose series) 11/28/2014   DTaP/Tdap/Td vaccine (3 - Tdap) 12/07/2017   COVID-19 Vaccine (4 - 2024-25 season) 04/23/2023   Flu Shot  03/22/2024   Medicare Annual Wellness Visit  03/28/2025   Pneumococcal Vaccine for age over 29  Completed   Hepatitis C Screening  Completed   HPV Vaccine  Aged Out   Meningitis B Vaccine  Aged Out    Advanced directives: (Declined) Advance directive discussed with you today. Even though you declined this today, please call our office should you change your mind, and we can give you the proper paperwork for you to fill out. Advance Care Planning is important because it:  [x]  Makes sure you receive the medical care that is consistent with your values, goals, and preferences  [x]  It provides guidance to your family and loved ones and reduces their decisional burden about  whether or not they are making the right decisions based on your wishes.  Follow the link provided in your after visit summary or read over the paperwork we have mailed to you to help you started getting your Advance Directives in place. If you need assistance in completing these, please reach out to us  so that we can help you!  See attachments for Preventive Care and Fall Prevention Tips.

## 2024-05-07 ENCOUNTER — Other Ambulatory Visit: Payer: Self-pay | Admitting: Family Medicine

## 2024-05-13 ENCOUNTER — Other Ambulatory Visit: Payer: Self-pay | Admitting: Family Medicine

## 2024-05-16 ENCOUNTER — Ambulatory Visit: Admitting: Family Medicine

## 2024-05-17 NOTE — Telephone Encounter (Signed)
 LOV: 04/03/2023 NOV: 06/04/2024  Last Refill: pravastatin  (PRAVACHOL ) 40 MG tablet  10/20/2023 90 tablets 1 refills  Patient has not has labs since 07/28/2022

## 2024-06-04 ENCOUNTER — Encounter: Payer: Self-pay | Admitting: Family Medicine

## 2024-06-04 ENCOUNTER — Ambulatory Visit (INDEPENDENT_AMBULATORY_CARE_PROVIDER_SITE_OTHER): Admitting: Family Medicine

## 2024-06-04 VITALS — BP 136/74 | HR 59 | Temp 98.0°F | Ht 68.7 in | Wt 174.6 lb

## 2024-06-04 DIAGNOSIS — Z7189 Other specified counseling: Secondary | ICD-10-CM

## 2024-06-04 DIAGNOSIS — N529 Male erectile dysfunction, unspecified: Secondary | ICD-10-CM

## 2024-06-04 DIAGNOSIS — M25511 Pain in right shoulder: Secondary | ICD-10-CM | POA: Diagnosis not present

## 2024-06-04 DIAGNOSIS — E785 Hyperlipidemia, unspecified: Secondary | ICD-10-CM

## 2024-06-04 DIAGNOSIS — I493 Ventricular premature depolarization: Secondary | ICD-10-CM | POA: Diagnosis not present

## 2024-06-04 DIAGNOSIS — Z0000 Encounter for general adult medical examination without abnormal findings: Secondary | ICD-10-CM

## 2024-06-04 DIAGNOSIS — M25519 Pain in unspecified shoulder: Secondary | ICD-10-CM | POA: Insufficient documentation

## 2024-06-04 DIAGNOSIS — Z125 Encounter for screening for malignant neoplasm of prostate: Secondary | ICD-10-CM

## 2024-06-04 DIAGNOSIS — Z1211 Encounter for screening for malignant neoplasm of colon: Secondary | ICD-10-CM

## 2024-06-04 MED ORDER — METOPROLOL SUCCINATE ER 25 MG PO TB24: 12.5000 mg | ORAL_TABLET | Freq: Every day | ORAL | 3 refills | Status: AC

## 2024-06-04 MED ORDER — SILDENAFIL CITRATE 20 MG PO TABS: 20.0000 mg | ORAL_TABLET | Freq: Every day | ORAL | 12 refills | Status: AC | PRN

## 2024-06-04 MED ORDER — PRAVASTATIN SODIUM 40 MG PO TABS: 40.0000 mg | ORAL_TABLET | Freq: Every day | ORAL | 3 refills | Status: AC

## 2024-06-04 NOTE — Progress Notes (Signed)
 Elevated Cholesterol: Using medications without problems: yes Muscle aches: no Diet compliance: yes Exercise: yes Labs pending.  To be done when possible.    Flu to be done at pharmacy.  Shingles d/w pt.  PNA prev done.  Tetanus d/w pt.  Covid vaccine up to date.   Cologuard ordered 2025 Prostate cancer screening pending.  Advance directive-wife designated patient were incapacitated.  ED.  No NTG use.  D/w pt about prn sildenafil  use with routine cautions.   Palpitations improved in metoprolol  w/o ADE on med. No syncope.  no CP.  He can still exert w/o difficulty.    Shoulder pain.  Episodic pain.  Pain at night, sleeping on R side.  Still doing tent work, physical work.    PMH and SH reviewed  Meds, vitals, and allergies reviewed.   ROS: Per HPI unless specifically indicated in ROS section   GEN: nad, alert and oriented HEENT: mucous membranes moist NECK: supple w/o LA CV: rrr. PULM: ctab, no inc wob ABD: soft, +bs EXT: no edema SKIN: no acute rash R shoulder with normal ROM and testing today.  No pain on ROM.  AC not ttp.  Normal int/ext rotation, no arm drop.  Normal biceps testing. Normal grip.  Distally NV intact.

## 2024-06-04 NOTE — Assessment & Plan Note (Signed)
 Advance directive-wife designated patient were incapacitated.

## 2024-06-04 NOTE — Patient Instructions (Addendum)
 I would get a flu shot each fall.   Check with pharmacy about tetanus and shingles.  Take care.  Glad to see you. Ask the front about scheduling a fasting lab visit.   If you keep having shoulder pain, then ask about seeing Dr. Watt.

## 2024-06-04 NOTE — Assessment & Plan Note (Signed)
 Continue work on diet and exercise.  Continue pravastatin .  Labs pending.  To be done when possible.

## 2024-06-04 NOTE — Assessment & Plan Note (Signed)
 Reassuring exam, wouldn't need tx at this point. If having more shoulder pain, then I would like him to ask about seeing Dr. Watt.

## 2024-06-04 NOTE — Assessment & Plan Note (Signed)
 Continue metoprolol  as is.  Return for labs.

## 2024-06-04 NOTE — Assessment & Plan Note (Signed)
 No NTG use.  D/w pt about prn sildenafil  use with routine cautions.

## 2024-06-04 NOTE — Assessment & Plan Note (Signed)
 Flu to be done at pharmacy.  Shingles d/w pt.  PNA prev done.  Tetanus d/w pt.  Covid vaccine up to date.   Cologuard ordered 2025 Prostate cancer screening pending.  Advance directive-wife designated patient were incapacitated.

## 2024-06-11 ENCOUNTER — Other Ambulatory Visit (INDEPENDENT_AMBULATORY_CARE_PROVIDER_SITE_OTHER)

## 2024-06-11 DIAGNOSIS — Z125 Encounter for screening for malignant neoplasm of prostate: Secondary | ICD-10-CM

## 2024-06-11 DIAGNOSIS — E785 Hyperlipidemia, unspecified: Secondary | ICD-10-CM | POA: Diagnosis not present

## 2024-06-11 LAB — PSA, MEDICARE: PSA: 1.69 ng/mL (ref 0.10–4.00)

## 2024-06-11 LAB — LIPID PANEL
Cholesterol: 153 mg/dL (ref 0–200)
HDL: 36.3 mg/dL — ABNORMAL LOW (ref >39.00)
LDL Cholesterol: 78 mg/dL (ref 0–99)
NonHDL: 117.18
Total CHOL/HDL Ratio: 4
Triglycerides: 196 mg/dL — ABNORMAL HIGH (ref 0.0–149.0)
VLDL: 39.2 mg/dL (ref 0.0–40.0)

## 2024-06-11 LAB — COMPREHENSIVE METABOLIC PANEL WITH GFR
ALT: 28 U/L (ref 0–53)
AST: 25 U/L (ref 0–37)
Albumin: 4.3 g/dL (ref 3.5–5.2)
Alkaline Phosphatase: 73 U/L (ref 39–117)
BUN: 19 mg/dL (ref 6–23)
CO2: 27 meq/L (ref 19–32)
Calcium: 9.3 mg/dL (ref 8.4–10.5)
Chloride: 104 meq/L (ref 96–112)
Creatinine, Ser: 1.1 mg/dL (ref 0.40–1.50)
GFR: 68.58 mL/min (ref >60.00)
Glucose, Bld: 101 mg/dL — ABNORMAL HIGH (ref 70–99)
Potassium: 4.7 meq/L (ref 3.5–5.1)
Sodium: 139 meq/L (ref 135–145)
Total Bilirubin: 0.9 mg/dL (ref 0.2–1.2)
Total Protein: 6.4 g/dL (ref 6.0–8.3)

## 2024-06-16 ENCOUNTER — Ambulatory Visit: Payer: Self-pay | Admitting: Family Medicine

## 2024-06-30 LAB — COLOGUARD: COLOGUARD: NEGATIVE

## 2025-04-01 ENCOUNTER — Ambulatory Visit

## 2025-04-04 ENCOUNTER — Ambulatory Visit
